# Patient Record
Sex: Female | Born: 1940 | Race: Black or African American | Hispanic: No | Marital: Married | Smoking: Former smoker
Health system: Southern US, Community
[De-identification: ages and names within clinical notes are randomized; demographics above are authoritative.]

## PROBLEM LIST (undated history)

## (undated) DIAGNOSIS — R7301 Impaired fasting glucose: Secondary | ICD-10-CM

## (undated) DIAGNOSIS — K635 Polyp of colon: Secondary | ICD-10-CM

## (undated) DIAGNOSIS — I10 Essential (primary) hypertension: Secondary | ICD-10-CM

## (undated) DIAGNOSIS — E785 Hyperlipidemia, unspecified: Secondary | ICD-10-CM

## (undated) DIAGNOSIS — E213 Hyperparathyroidism, unspecified: Secondary | ICD-10-CM

## (undated) DIAGNOSIS — H9319 Tinnitus, unspecified ear: Secondary | ICD-10-CM

## (undated) DIAGNOSIS — I2510 Atherosclerotic heart disease of native coronary artery without angina pectoris: Secondary | ICD-10-CM

## (undated) DIAGNOSIS — C50919 Malignant neoplasm of unspecified site of unspecified female breast: Secondary | ICD-10-CM

## (undated) DIAGNOSIS — M8580 Other specified disorders of bone density and structure, unspecified site: Secondary | ICD-10-CM

## (undated) HISTORY — DX: Atherosclerotic heart disease of native coronary artery without angina pectoris: I25.10

## (undated) HISTORY — DX: Malignant neoplasm of unspecified site of unspecified female breast: C50.919

## (undated) HISTORY — DX: Other specified disorders of bone density and structure, unspecified site: M85.80

## (undated) HISTORY — DX: Essential (primary) hypertension: I10

## (undated) HISTORY — DX: Impaired fasting glucose: R73.01

## (undated) HISTORY — DX: Hyperparathyroidism, unspecified: E21.3

## (undated) HISTORY — DX: Hyperlipidemia, unspecified: E78.5

## (undated) HISTORY — DX: Tinnitus, unspecified ear: H93.19

## (undated) HISTORY — DX: Polyp of colon: K63.5

---

## 1988-07-30 HISTORY — PX: CHOLECYSTECTOMY: SHX55

## 1995-07-31 DIAGNOSIS — I2510 Atherosclerotic heart disease of native coronary artery without angina pectoris: Secondary | ICD-10-CM

## 1995-07-31 HISTORY — DX: Atherosclerotic heart disease of native coronary artery without angina pectoris: I25.10

## 1995-07-31 HISTORY — PX: CORONARY ARTERY BYPASS GRAFT: SHX141

## 2011-07-31 HISTORY — PX: BREAST SURGERY: SHX581

## 2011-11-21 ENCOUNTER — Ambulatory Visit (HOSPITAL_BASED_OUTPATIENT_CLINIC_OR_DEPARTMENT_OTHER)
Admission: RE | Admit: 2011-11-21 | Discharge: 2011-11-21 | Disposition: A | Discharge status: Home / Self Care | Payer: Medicare Other | Source: Ambulatory Visit | Attending: Family Medicine | Admitting: Family Medicine

## 2011-11-21 ENCOUNTER — Other Ambulatory Visit (HOSPITAL_BASED_OUTPATIENT_CLINIC_OR_DEPARTMENT_OTHER): Payer: Self-pay | Admitting: Family Medicine

## 2011-11-21 DIAGNOSIS — R42 Dizziness and giddiness: Secondary | ICD-10-CM | POA: Insufficient documentation

## 2011-11-21 DIAGNOSIS — W19XXXA Unspecified fall, initial encounter: Secondary | ICD-10-CM | POA: Insufficient documentation

## 2011-11-21 DIAGNOSIS — R51 Headache: Secondary | ICD-10-CM

## 2011-11-21 DIAGNOSIS — S0990XA Unspecified injury of head, initial encounter: Secondary | ICD-10-CM

## 2011-11-21 IMAGING — CT CT HEAD W/O CM
1 series · 16 of 30 positions shown, 20 images · non-contrast
Comparison: None.

CLINICAL DATA: Headache and dizziness.  Fall with head trauma.

CT HEAD WITHOUT CONTRAST
TECHNIQUE: Contiguous axial images were obtained from the base of
the skull through the vertex without contrast.

[Series 2: head 4.8 h37s · axial · 0.45mm/px · z∈[+1127,+1263]mm · 16 of 32 slices shown, 20 images]
[im 2/32  brain]
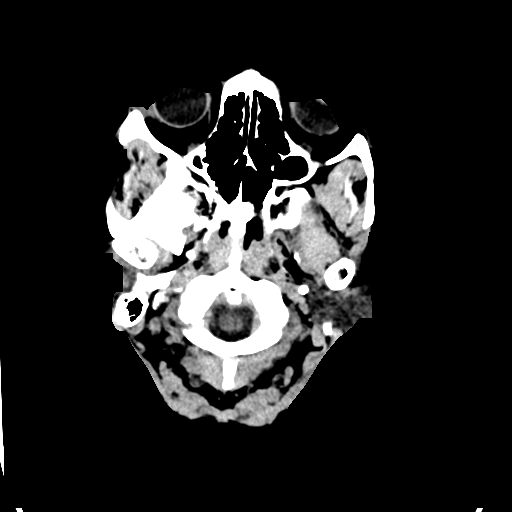
[im 2/32  bone]
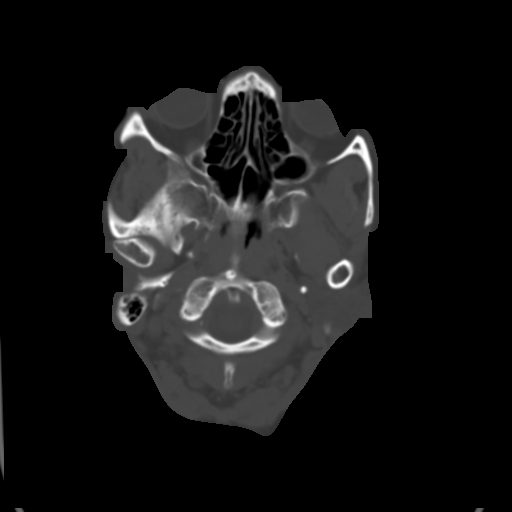
[im 4/32  brain]
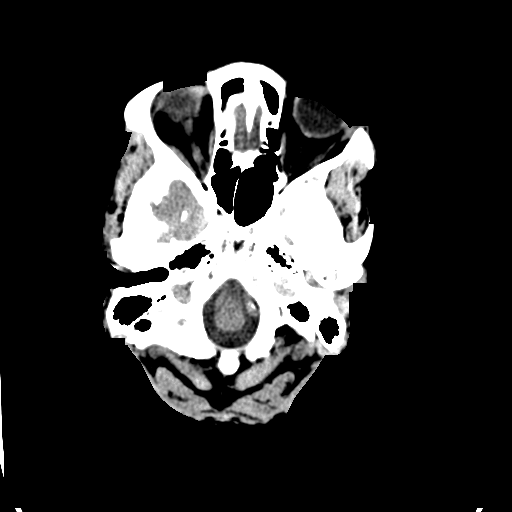
[im 6/32  brain]
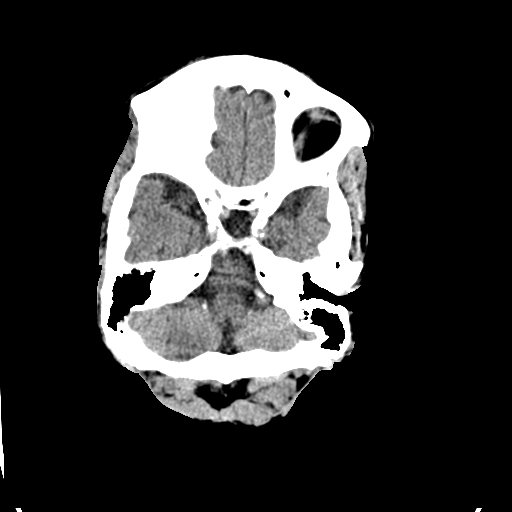
[im 8/32  brain]
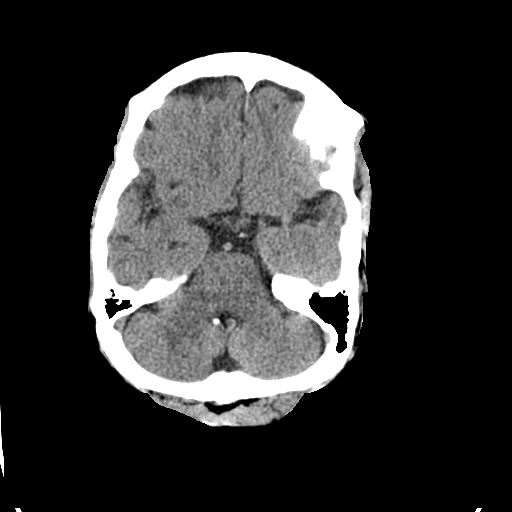
[im 9/32  brain]
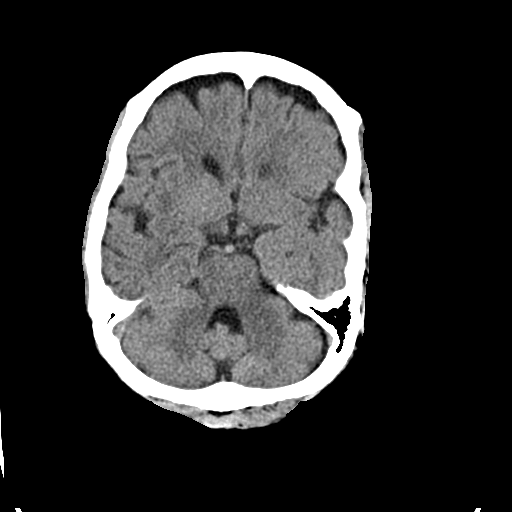
[im 9/32  bone]
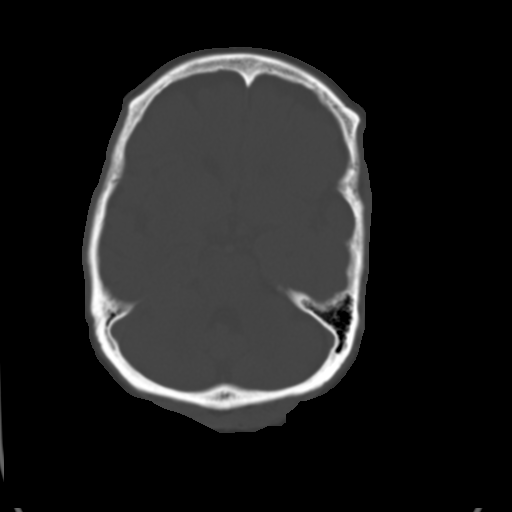
[im 11/32  brain]
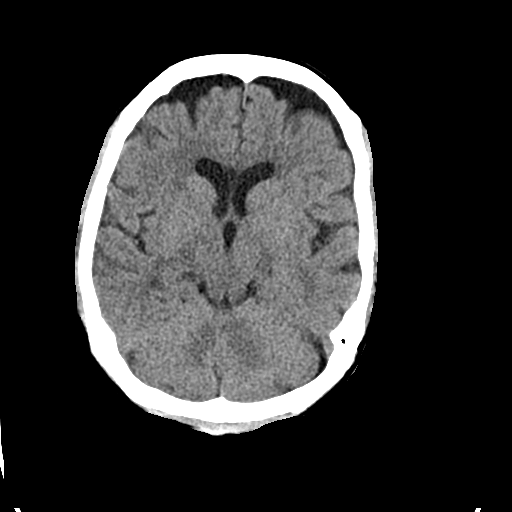
[im 13/32  brain]
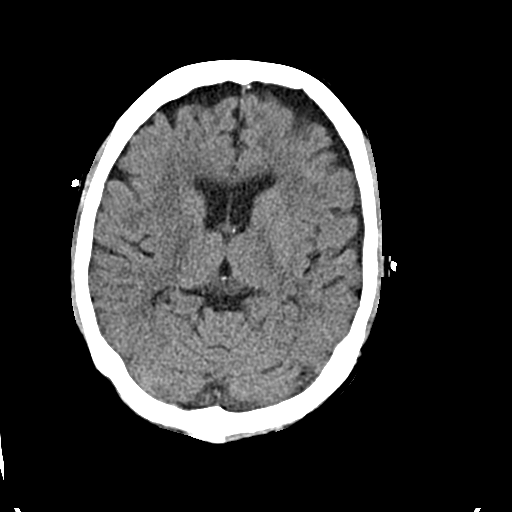
[im 15/32  brain]
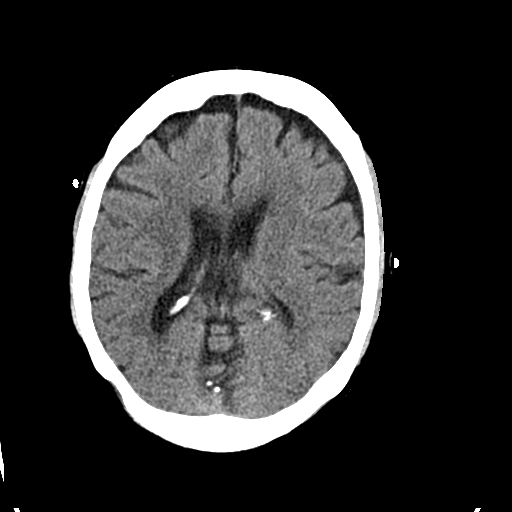
[im 17/32  brain]
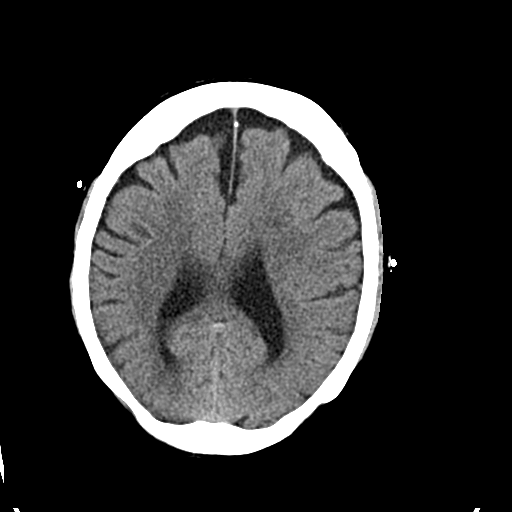
[im 17/32  bone]
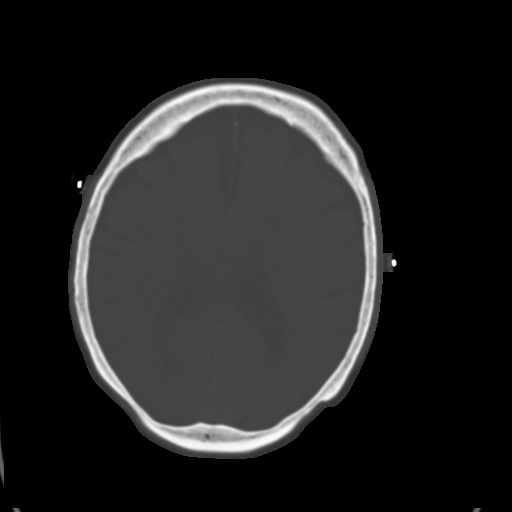
[im 19/32  brain]
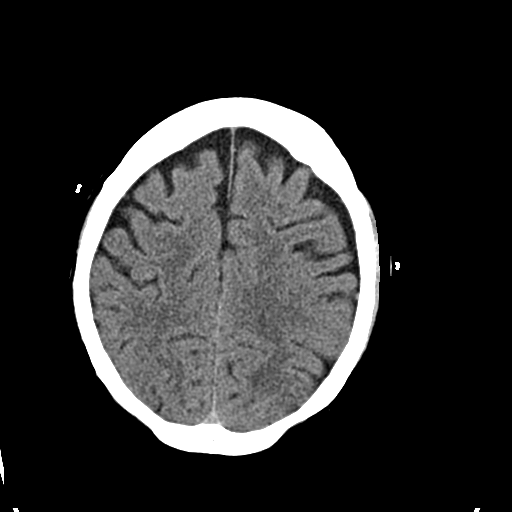
[im 21/32  brain]
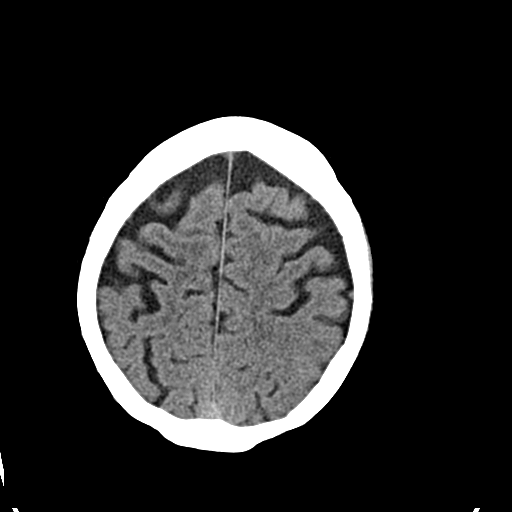
[im 23/32  brain]
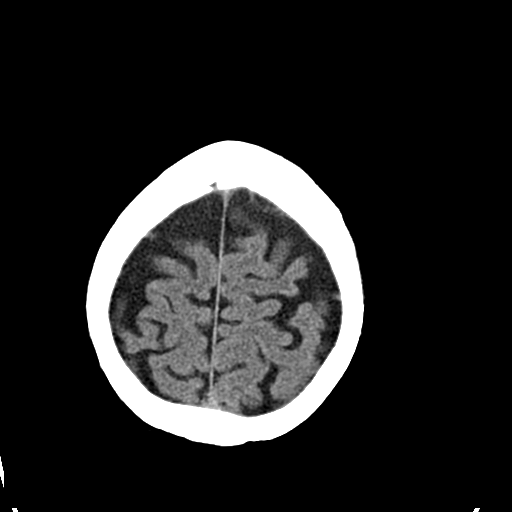
[im 24/32  brain]
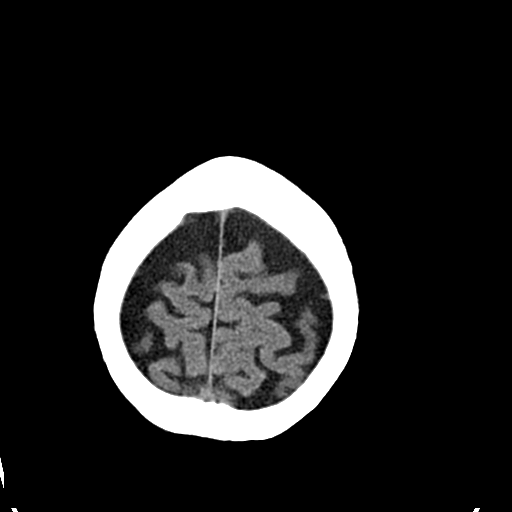
[im 24/32  bone]
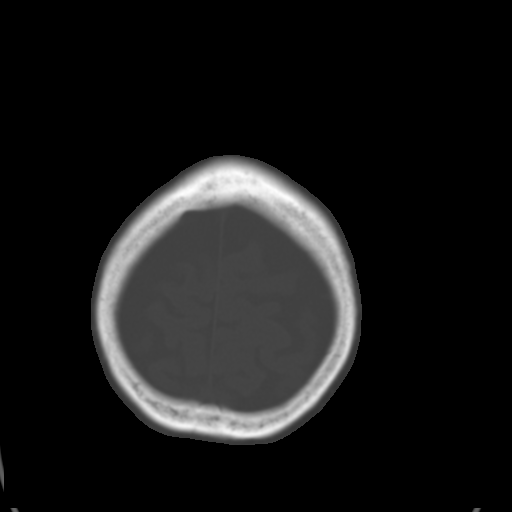
[im 26/32  brain]
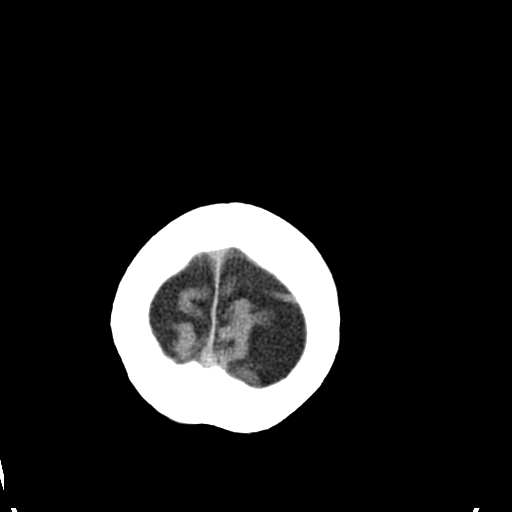
[im 28/32  brain]
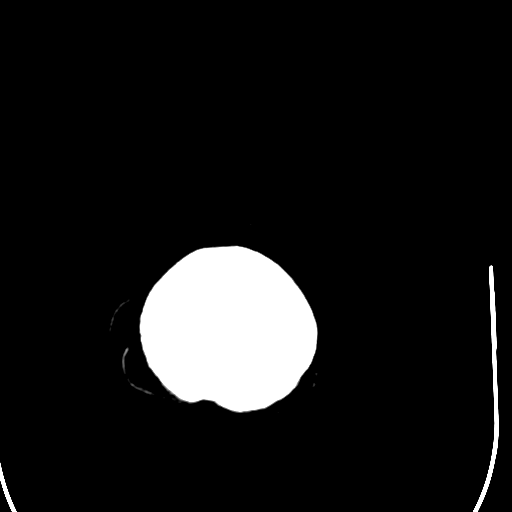
[im 30/32  brain]
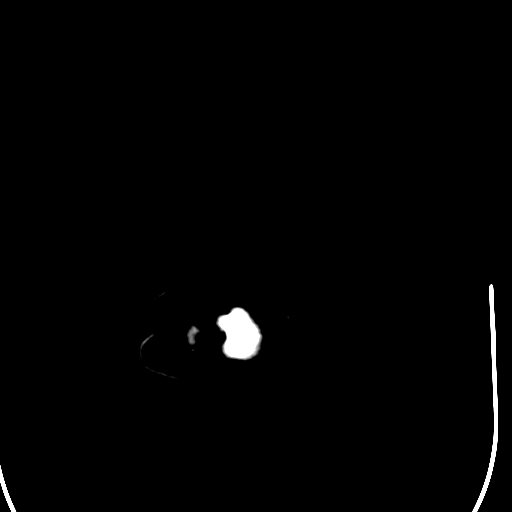

[16 of 30 positions shown; findings below may reference images not displayed]

FINDINGS: The brain shows generalized atrophy.  There is low
density in the subcortical white matter of the right frontal region
consistent with an old infarction.  No sign of acute infarction,
mass lesion, hemorrhage, hydrocephalus or extra-axial collection.
There is atherosclerotic calcification of the major vessels at the
base of the brain.  No skull fracture.  No fluid in the sinuses,
middle ears or mastoids.
IMPRESSION: Atrophy.  No acute or traumatic finding.

## 2012-02-04 LAB — LIPID PANEL
Cholesterol: 158 mg/dL (ref 0–200)
HDL: 48 mg/dL (ref 35–70)

## 2012-03-30 DIAGNOSIS — E213 Hyperparathyroidism, unspecified: Secondary | ICD-10-CM

## 2012-03-30 HISTORY — DX: Hyperparathyroidism, unspecified: E21.3

## 2012-12-02 ENCOUNTER — Telehealth: Payer: Self-pay | Admitting: Family Medicine

## 2012-12-02 NOTE — Telephone Encounter (Signed)
Yes. Bring her at 2 pm. Thanks PG

## 2012-12-02 NOTE — Telephone Encounter (Signed)
PATIENT SAYS THAT SHE IS NOT FEELING WELL AND IS TAKING MEDICATION FOR MEMORY. SHE WANTS TO BE SEEN TODAY.

## 2012-12-04 ENCOUNTER — Ambulatory Visit (INDEPENDENT_AMBULATORY_CARE_PROVIDER_SITE_OTHER): Payer: Medicare Other | Admitting: Family Medicine

## 2012-12-04 ENCOUNTER — Encounter: Payer: Self-pay | Admitting: Family Medicine

## 2012-12-04 VITALS — BP 143/78 | HR 49 | Ht 62.5 in | Wt 132.0 lb

## 2012-12-04 DIAGNOSIS — R413 Other amnesia: Secondary | ICD-10-CM

## 2012-12-04 DIAGNOSIS — F4329 Adjustment disorder with other symptoms: Secondary | ICD-10-CM

## 2012-12-04 DIAGNOSIS — E213 Hyperparathyroidism, unspecified: Secondary | ICD-10-CM

## 2012-12-04 DIAGNOSIS — F4389 Other reactions to severe stress: Secondary | ICD-10-CM

## 2012-12-04 DIAGNOSIS — R5381 Other malaise: Secondary | ICD-10-CM

## 2012-12-04 DIAGNOSIS — F438 Other reactions to severe stress: Secondary | ICD-10-CM

## 2012-12-04 MED ORDER — FLUOXETINE HCL 20 MG PO CAPS: 20.0000 mg | ORAL_CAPSULE | Freq: Every day | ORAL | Status: DC

## 2012-12-04 MED ORDER — MEMANTINE HCL ER 28 MG PO CP24: 1.0000 | ORAL_CAPSULE | Freq: Every day | ORAL | Status: DC

## 2012-12-04 NOTE — Progress Notes (Signed)
  Subjective:    Patient ID: Margaret Moore, female    DOB: 1940-09-11, 72 y.o.   MRN: [id number]  HPI Margaret Moore is here today with her husband Margaret Moore to discuss a few issues.  1)  Fatigue:  She has started to feel very tired over the past several months.    2)   Memory Loss:  She is concerned with her memory.  She feels that her short term memory has declined.  3)  Stress:  This has been a very stressful year for both Margaret Moore and her husband.  Both of them have had increased medical issues and she feels that she is having a harder time taking care of everything that she needs to take care of.       Review of Systems  Constitutional: Positive for activity change and fatigue. Negative for appetite change and unexpected weight change.  HENT: Negative.   Respiratory: Negative for chest tightness and shortness of breath.   Cardiovascular: Negative for chest pain, palpitations and leg swelling.  Gastrointestinal: Negative.   Genitourinary: Negative.   Neurological:       Memory Loss   Psychiatric/Behavioral: Positive for dysphoric mood. Negative for sleep disturbance. The patient is nervous/anxious.        Stressed    Past Medical History  Diagnosis Date  . Cardiovascular disease 1997    Quadruple Bypass Surgery 1997  . Hyperlipidemia   . Hypertension   . 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    . IFG (impaired fasting glucose)   . Tinnitus   . Colon polyps   . Osteopenia    Family History  Problem Relation Age of Onset  . Stroke Mother   . Diabetes Mother   . Heart disease Sister   . Diabetes Sister   . Cancer Sister   . Heart disease Brother    History   Social History Narrative   arital Status: Married Advertising account planner)   Children:  Daughter (Deceased) Step-Children (Sons - 3; Daughter -1) [name] (She lives in [place])   Pets: Cats   Living Situation: Lives with  spouse   Occupation: Retired  Facilities manager (Disabled)   Education: 9 th Grade    Tobacco Use/Exposure:  She smoked 1/2 ppd for > 20 years.  She quit smoking 20 years ago.     Alcohol Use:  None   Drug Use:  None   Diet:  Regular Low Sodium   Exercise:  Walking   Hobbies: Gardening, Flowers                     Objective:   Physical Exam  Constitutional: She appears well-nourished. No distress.  HENT:  Head: Normocephalic.  Eyes: No scleral icterus.  Neck: No thyromegaly present.  Cardiovascular: Normal rate, regular rhythm and normal heart sounds.   Pulmonary/Chest: Effort normal and breath sounds normal.  Abdominal: There is no tenderness.  Musculoskeletal: She exhibits no edema and no tenderness.  Neurological: She is alert.  Skin: Skin is warm and dry.  Psychiatric: She has a normal mood and affect. Her behavior is normal. Judgment and thought content normal.      Assessment & Plan:   TIME 30 MINUTES:  MORE THAN 50 % OF TIME WAS INVOLVED IN COUNSELING.

## 2012-12-04 NOTE — Patient Instructions (Addendum)
1)  Memory - Start on the Namenda sample as directed.  Your prescription is for the 28 mg (green pill). You also may want to take a fish oil called DHA 1000 mg per day to help with memory.    2)  Stress/Mood - Start on the fluoxetine daily.  Be sure and take it every day.     Stress Stress-related medical problems are becoming increasingly common. The body has a built-in physical response to stressful situations. Faced with pressure, challenge or danger, we need to react quickly. Our bodies release hormones such as cortisol and adrenaline to help do this. These hormones are part of the "fight or flight" response and affect the metabolic rate, heart rate and blood pressure, resulting in a heightened, stressed state that prepares the body for optimum performance in dealing with a stressful situation. It is likely that early man required these mechanisms to stay alive, but usually modern stresses do not call for this, and the same hormones released in today's world can damage health and reduce coping ability. CAUSES  Pressure to perform at work, at school or in sports.  Threats of physical violence.  Money worries.  Arguments.  Family conflicts.  Divorce or separation from significant other.  Bereavement.  New job or unemployment.  Changes in location.  Alcohol or drug abuse. SOMETIMES, THERE IS NO PARTICULAR REASON FOR DEVELOPING STRESS. Almost all people are at risk of being stressed at some time in their lives. It is important to know that some stress is temporary and some is long term.  Temporary stress will go away when a situation is resolved. Most people can cope with short periods of stress, and it can often be relieved by relaxing, taking a walk, chatting through issues with friends, or having a good night's sleep.  Chronic (long-term, continuous) stress is much harder to deal with. It can be psychologically and emotionally damaging. It can be harmful both for an individual  and for friends and family. SYMPTOMS Everyone reacts to stress differently. There are some common effects that help [place] recognize it. In times of extreme stress, people may:  Shake uncontrollably.  Breathe faster and deeper than normal (hyperventilate).  Vomit.  For people with asthma, stress can trigger an attack.  For some people, stress may trigger migraine headaches, ulcers, and body pain. PHYSICAL EFFECTS OF STRESS MAY INCLUDE:  Loss of energy.  Skin problems.  Aches and pains resulting from tense muscles, including neck ache, backache and tension headaches.  Increased pain from arthritis and other conditions.  Irregular heart beat (palpitations).  Periods of irritability or anger.  Apathy or depression.  Anxiety (feeling uptight or worrying).  Unusual behavior.  Loss of appetite.  Comfort eating.  Lack of concentration.  Loss of, or decreased, sex-drive.  Increased smoking, drinking, or recreational drug use.  For women, missed periods.  Ulcers, joint pain, and muscle pain. Post-traumatic stress is the stress caused by any serious accident, strong emotional damage, or extremely difficult or violent experience such as rape or war. Post-traumatic stress victims can experience mixtures of emotions such as fear, shame, depression, guilt or anger. It may include recurrent memories or images that may be haunting. These feelings can last for weeks, months or even years after the traumatic event that triggered them. Specialized treatment, possibly with medicines and psychological therapies, is available. If stress is causing physical symptoms, severe distress or making it difficult for you to function as normal, it is worth seeing your caregiver.  It is important to remember that although stress is a usual part of life, extreme or prolonged stress can lead to other illnesses that will need treatment. It is better to visit a doctor sooner rather than later. Stress has been  linked to the development of high blood pressure and heart disease, as well as insomnia and depression. There is no diagnostic test for stress since everyone reacts to it differently. But a caregiver will be able to spot the physical symptoms, such as:  Headaches.  Shingles.  Ulcers. Emotional distress such as intense worry, low mood or irritability should be detected when the doctor asks pertinent questions to identify any underlying problems that might be the cause. In case there are physical reasons for the symptoms, the doctor may also want to do some tests to exclude certain conditions. If you feel that you are suffering from stress, try to identify the aspects of your life that are causing it. Sometimes you may not be able to change or avoid them, but even a small change can have a positive ripple effect. A simple lifestyle change can make all the difference. STRATEGIES THAT CAN HELP DEAL WITH STRESS:  Delegating or sharing responsibilities.  Avoiding confrontations.  Learning to be more assertive.  Regular exercise.  Avoid using alcohol or street drugs to cope.  Eating a healthy, balanced diet, rich in fruit and vegetables and proteins.  Finding humor or absurdity in stressful situations.  Never taking on more than you know you can handle comfortably.  Organizing your time better to get as much done as possible.  Talking to friends or family and sharing your thoughts and fears.  Listening to music or relaxation tapes.  Tensing and then relaxing your muscles, starting at the toes and working up to the head and neck. If you think that you would benefit from help, either in identifying the things that are causing your stress or in learning techniques to help you relax, see a caregiver who is capable of helping you with this. Rather than relying on medications, it is usually better to try and identify the things in your life that are causing stress and try to deal with  them. There are many techniques of managing stress including counseling, psychotherapy, aromatherapy, yoga, and exercise. Your caregiver can help you determine what is best for you. Document Released: 10/06/2002 Document Revised: 10/08/2011 Document Reviewed: 09/02/2007 [facility] Patient Information 2013 [place], [place]. Dementia Dementia is a general term for problems with brain function. A person with dementia has memory loss and a hard time with at least one other brain function such as thinking, speaking, or problem solving. Dementia can affect social functioning, how you do your job, your mood, or your personality. The changes may be hidden for a long time. The earliest forms of this disease are usually not detected by family or friends. Dementia can be:  Irreversible.  Potentially reversible.  Partially reversible.  Progressive. This means it can get worse over time. CAUSES  Irreversible dementia causes may include:  Degeneration of brain cells (Alzheimer's disease or lewy body dementia).  Multiple small strokes (vascular dementia).  Infection (chronic meningitis or Creutzfelt-Jakob disease).  Frontotemporal dementia. This affects younger people, age 16 to 24, compared to those who have Alzheimer's disease.  Dementia associated with other disorders like Parkinson's disease, Huntington's disease, or HIV-associated dementia. Potentially or partially reversible dementia causes may include:  Medicines.  Metabolic causes such as excessive alcohol intake, vitamin B12 deficiency, or thyroid disease.  Masses or pressure in the brain such as a tumor, blood clot, or hydrocephalus. SYMPTOMS  Symptoms are often hard to detect. Family members or coworkers may not notice them early in the disease process. Different people with dementia may have different symptoms. Symptoms can include:  A hard time with memory, especially recent memory. Long-term memory may not be impaired.  Asking the  same question multiple times or forgetting something someone just said.  A hard time speaking your thoughts or finding certain words.  A hard time solving problems or performing familiar tasks (such as how to use a telephone).  Sudden changes in mood.  Changes in personality, especially increasing moodiness or mistrust.  Depression.  A hard time understanding complex ideas that were never a problem in the past. DIAGNOSIS  There are no specific tests for dementia.   Your caregiver may recommend a thorough evaluation. This is because some forms of dementia can be reversible. The evaluation will likely include a physical exam and getting a detailed history from you and a family member. The history often gives the best clues and suggestions for a diagnosis.  Memory testing may be done. A detailed brain function evaluation called neuropsychologic testing may be helpful.  Lab tests and brain imaging (such as a CT scan or MRI scan) are sometimes important.  Sometimes observation and re-evaluation over time is very helpful. TREATMENT  Treatment depends on the cause.   If the problem is a vitamin deficiency, it may be helped or cured with supplements.  For dementias such as Alzheimer's disease, medicines are available to stabilize or slow the course of the disease. There are no cures for this type of dementia.  Your caregiver can help direct you to groups, organizations, and other caregivers to help with decisions in the care of you or your loved one. HOME CARE INSTRUCTIONS The care of individuals with dementia is varied and dependent upon the progression of the dementia. The following suggestions are intended for the person living with, or caring for, the person with dementia.  Create a safe environment.  Remove the locks on bathroom doors to prevent the person from accidentally locking himself or herself in.  Use childproof latches on kitchen cabinets and any place where cleaning  supplies, chemicals, or alcohol are kept.  Use childproof covers in unused electrical outlets.  Install childproof devices to keep doors and windows secured.  Remove stove knobs or install safety knobs and an automatic shut-off on the stove.  Lower the temperature on water heaters.  Label medicines and keep them locked up.  Secure knives, lighters, matches, power tools, and guns, and keep these items out of reach.  Keep the house free from clutter. Remove rugs or anything that might contribute to a fall.  Remove objects that might break and hurt the person.  Make sure lighting is good, both inside and outside.  Install grab rails as needed.  Use a monitoring device to alert you to falls or other needs for help.  Reduce confusion.  Keep familiar objects and people around.  Use night lights or dim lights at night.  Label items or areas.  Use reminders, notes, or directions for daily activities or tasks.  Keep a simple, consistent routine for waking, meals, bathing, dressing, and bedtime.  Create a calm, quiet environment.  Place large clocks and calendars prominently.  Display emergency numbers and home address near all telephones.  Use cues to establish different times of the day. An example is to  open curtains to let the natural light in during the day.   Use effective communication.  Choose simple words and short sentences.  Use a gentle, calm tone of voice.  Be careful not to interrupt.  If the person is struggling to find a word or communicate a thought, try to provide the word or thought.  Ask one question at a time. Allow the person ample time to answer questions. Repeat the question again if the person does not respond.  Reduce nighttime restlessness.  Provide a comfortable bed.  Have a consistent nighttime routine.  Ensure a regular walking or physical activity schedule. Involve the person in daily activities as much as possible.  Limit napping  during the day.  Limit caffeine.  Attend social events that stimulate rather than overwhelm the senses.  Encourage good nutrition and hydration.  Reduce distractions during meal times and snacks.  Avoid foods that are too hot or too cold.  Monitor chewing and swallowing ability.  Continue with routine vision, hearing, dental, and medical screenings.  Only give over-the-counter or prescription medicines as directed by the caregiver.  Monitor driving abilities. Do not allow the person to drive when safe driving is no longer possible.  Register with an identification program which could provide location assistance in the event of a missing person situation. SEEK MEDICAL CARE IF:   New behavioral problems start such as moodiness, aggressiveness, or seeing things that are not there (hallucinations).  Any new problem with brain function happens. This includes problems with balance, speech, or falling a lot.  Problems with swallowing develop.  Any symptoms of other illness happen. Small changes or worsening in any aspect of brain function can be a sign that the illness is getting worse. It can also be a sign of another medical illness such as infection. Seeing a caregiver right away is important. SEEK IMMEDIATE MEDICAL CARE IF:   A fever develops.  New or worsened confusion develops.  New or worsened sleepiness develops.  Staying awake becomes hard to do. Document Released: 01/09/2001 Document Revised: 10/08/2011 Document Reviewed: 12/11/2010 [facility] Patient Information 2013 [place], [place].

## 2012-12-14 ENCOUNTER — Encounter: Payer: Self-pay | Admitting: Family Medicine

## 2012-12-14 DIAGNOSIS — R413 Other amnesia: Secondary | ICD-10-CM | POA: Insufficient documentation

## 2012-12-14 DIAGNOSIS — R5383 Other fatigue: Secondary | ICD-10-CM | POA: Insufficient documentation

## 2012-12-14 DIAGNOSIS — E213 Hyperparathyroidism, unspecified: Secondary | ICD-10-CM | POA: Insufficient documentation

## 2012-12-14 DIAGNOSIS — R5381 Other malaise: Secondary | ICD-10-CM | POA: Insufficient documentation

## 2012-12-14 DIAGNOSIS — F4329 Adjustment disorder with other symptoms: Secondary | ICD-10-CM | POA: Insufficient documentation

## 2012-12-14 NOTE — Assessment & Plan Note (Signed)
This has been a very rough year for Margaret Moore.  She did take some Celexa for a short time last fall which seemed to help her. We'll see how she does on the Namenda and may consider adding a SSRI.

## 2012-12-14 NOTE — Assessment & Plan Note (Signed)
She is to return for a check of her TSH and Free T4.

## 2012-12-14 NOTE — Assessment & Plan Note (Signed)
She was noted to have an increased calcium last fall which led to further evaluation of her parathyroid glands. She was sent for a nuclear scan and was noted to have an adenoma. She has seen Dr. Claudine Mouton for this.  We'll check into what ever came of this and what the plan for her is.  We'll recheck her level to see where her level is.

## 2012-12-14 NOTE — Assessment & Plan Note (Signed)
She was given another sample pack and a prescription for Namenda.

## 2012-12-26 ENCOUNTER — Encounter: Payer: Self-pay | Admitting: Family Medicine

## 2012-12-26 ENCOUNTER — Ambulatory Visit (INDEPENDENT_AMBULATORY_CARE_PROVIDER_SITE_OTHER): Payer: Medicare Other | Admitting: Family Medicine

## 2012-12-26 VITALS — BP 147/76 | HR 50 | Wt 134.0 lb

## 2012-12-26 DIAGNOSIS — N949 Unspecified condition associated with female genital organs and menstrual cycle: Secondary | ICD-10-CM

## 2012-12-26 DIAGNOSIS — R102 Pelvic and perineal pain: Secondary | ICD-10-CM

## 2012-12-26 MED ORDER — PROGESTERONE MICRONIZED 100 MG PO CAPS: 100.0000 mg | ORAL_CAPSULE | Freq: Every day | ORAL | Status: DC

## 2012-12-26 MED ORDER — ESTRADIOL 25 MCG VA TABS: 25.0000 ug | ORAL_TABLET | VAGINAL | Status: AC

## 2012-12-26 NOTE — Progress Notes (Signed)
  Subjective:    Patient ID: Margaret Moore, female    DOB: 10-08-1940, 72 y.o.   MRN: [id number]  Margaret Moore is in today complaining of vaginal pain.  Vaginal Pain The patient's primary symptoms include genital itching and a vaginal discharge. This is a recurrent problem. The current episode started 1 to 4 weeks ago. The problem occurs intermittently. The problem has been unchanged. The pain is mild. The problem affects both sides. She is not pregnant. The vaginal discharge was milky and white. The treatment provided no relief (Terconazole given by Dr Alyce Pagan). She is sexually active. No, her partner does not have an STD. She uses nothing for contraception.   Review of Systems  Genitourinary: Positive for vaginal discharge and vaginal pain.   Past Medical History  Diagnosis Date  . Cardiovascular disease 1997    Quadruple Bypass Surgery 1997  . Hyperlipidemia   . Hypertension   . 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    . IFG (impaired fasting glucose)   . Tinnitus   . Colon polyps   . Osteopenia   . Hyperparathyroidism 03/2012    Left Superior Adenoma    Family History  Problem Relation Age of Onset  . Stroke Mother   . Diabetes Mother   . Heart disease Sister   . Diabetes Sister   . Cancer Sister   . Heart disease Brother    History   Social History Narrative   Marital Status: Married Advertising account planner)   Children:  Daughter (Deceased) Step-Children (Sons - 3; Daughter -1) [name] (She lives in [place])   Pets: Cats   Living Situation: Lives with  spouse   Occupation: Retired Facilities manager (Disabled)   Education: 9 th Grade    Tobacco Use/Exposure:  She smoked 1/2 ppd for > 20 years.  She quit smoking 20 years ago.     Alcohol Use:  None   Drug Use:  None   Diet:  Regular Low Sodium   Exercise:  Walking   Hobbies: Gardening, Flowers                        Objective:   Physical  Exam        Assessment & Plan:

## 2012-12-26 NOTE — Patient Instructions (Addendum)
1)  Vaginal Irritation/Pain -  Insert 1 Vagifem 2 times per week (Sun/Wed) for one month then go to 1-2 per week.  Take 1 Prometrium 100 mg at night to protect your uterus.      Atrophic Vaginitis Atrophic vaginitis is a problem of low levels of estrogen in women. This problem can happen at any age. It is most common in women who have gone through menopause ("the change").  HOW WILL I KNOW IF I HAVE THIS PROBLEM? You may have:  Trouble with peeing (urinating), such as:  Going to the bathroom often.  A hard time holding your pee until you reach a bathroom.  Leaking pee.  Having pain when you pee.  Itching or a burning feeling.  Vaginal bleeding and spotting.  Pain during sex.  Dryness of the vagina.  A yellow, bad-smelling fluid (discharge) coming from the vagina. HOW WILL MY DOCTOR CHECK FOR THIS PROBLEM?  During your exam, your doctor will likely find the problem.  If there is a vaginal fluid, it may be checked for infection. HOW WILL THIS PROBLEM BE TREATED? Keep the vulvar skin as clean as possible. Moisturizers and lubricants can help with some of the symptoms. Estrogen replacement can help. There are 2 ways to take estrogen:  Systemic estrogen gets estrogen to your whole body. It takes many weeks or months before the symptoms get better.  You take an estrogen pill.  You use a skin patch. This is a patch that you put on your skin.  If you still have your uterus, your doctor may ask you to take a hormone. Talk to your doctor about the right medicine for you.  Estrogen cream.  This puts estrogen only at the part of your body where you apply it. The cream is put into the vagina or put on the vulvar skin. For some women, estrogen cream works faster than pills or the patch. CAN ALL WOMEN WITH THIS PROBLEM USE ESTROGEN? No. Women with certain types of cancer, liver problems, or problems with blood clots should not take estrogen. Your doctor can help you decide the best  treatment for your symptoms. Document Released: 01/02/2008 Document Revised: 10/08/2011 Document Reviewed: 01/02/2008 [facility] Patient Information 2014 [place], [place].

## 2013-01-02 ENCOUNTER — Ambulatory Visit (INDEPENDENT_AMBULATORY_CARE_PROVIDER_SITE_OTHER): Payer: Medicare Other | Admitting: Family Medicine

## 2013-01-02 ENCOUNTER — Encounter: Payer: Self-pay | Admitting: Family Medicine

## 2013-01-02 VITALS — BP 115/72 | HR 54 | Wt 136.0 lb

## 2013-01-02 DIAGNOSIS — R102 Pelvic and perineal pain: Secondary | ICD-10-CM

## 2013-01-02 DIAGNOSIS — R42 Dizziness and giddiness: Secondary | ICD-10-CM

## 2013-01-02 DIAGNOSIS — N949 Unspecified condition associated with female genital organs and menstrual cycle: Secondary | ICD-10-CM

## 2013-01-02 DIAGNOSIS — R413 Other amnesia: Secondary | ICD-10-CM

## 2013-01-02 MED ORDER — MEMANTINE HCL ER 21 MG PO CP24: 1.0000 | ORAL_CAPSULE | Freq: Every day | ORAL | Status: DC

## 2013-01-02 NOTE — Patient Instructions (Addendum)
1)  Pain in vagina - Do the estrogen pill 1 x per week and continue on the progesterone orally at night. We are setting you up to see Dr. Arnette Schaumann with [facility] at the hospital so see what she thinks about your pain.

## 2013-01-02 NOTE — Progress Notes (Signed)
  Subjective:    Patient ID: Margaret Moore, female    DOB: [birth year], 72 y.o.   MRN: [id number]  HPI:    [name] is here today to discuss a few issues:    1)  Vaginal Pain:  She continues to have vaginal pain. She has had this off and on for several years.   She was seen by Dr. Alyce Pagan several weeks ago for the same complaint.  She says that when she saw him he told her that she had yeast infection.  He treated her with a vaginal cream which really did not help her pain.  When I saw her last week, I gave her some Vagifem and Prometrium thinking that her discomfort may be due to atrophic vaginitis.  She has been using it since last week and says that it has not helped her vaginal pain.      2)  Dizziness:  She has been feeling dizzy since she started on the Namenda XR 28 mg.   Review of Systems  Genitourinary: Positive for vaginal pain. Negative for vaginal bleeding, vaginal discharge and difficulty urinating.  Neurological: Positive for dizziness.     Past Medical History  Diagnosis Date  . Cardiovascular disease 1997    Quadruple Bypass Surgery 1997  . Hyperlipidemia   . Hypertension   . 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    . IFG (impaired fasting glucose)   . Tinnitus   . Colon polyps   . Osteopenia   . Hyperparathyroidism 03/2012    Left Superior Adenoma    Family History  Problem Relation Age of Onset  . Stroke Mother   . Diabetes Mother   . Heart disease Sister   . Diabetes Sister   . Cancer Sister   . Heart disease Brother    History   Social History Narrative   Marital Status: Married Advertising account planner)   Children:  Daughter (Deceased) Step-Children (Sons - 3; Daughter -1) [name] (She lives in [place])   Pets: Cats   Living Situation: Lives with  spouse   Occupation: Retired Facilities manager (Disabled)   Education: 9 th Grade    Tobacco Use/Exposure:  She smoked 1/2 ppd for > 20  years.  She quit smoking 20 years ago.     Alcohol Use:  None   Drug Use:  None   Diet:  Regular Low Sodium   Exercise:  Walking   Hobbies: Gardening, Flowers                        Objective:   Physical Exam  Cardiovascular: Normal rate, regular rhythm and normal heart sounds.   Pulmonary/Chest: Effort normal and breath sounds normal.  Genitourinary: Vagina normal. No vaginal discharge found.       Assessment & Plan:

## 2013-01-02 NOTE — Assessment & Plan Note (Signed)
Margaret Moore's discomfort is probably due to her atrophic vaginitis.  She is going to try a combination of Vagifem and Prometrium.

## 2013-01-04 DIAGNOSIS — R42 Dizziness and giddiness: Secondary | ICD-10-CM | POA: Insufficient documentation

## 2013-01-04 NOTE — Assessment & Plan Note (Addendum)
Margaret Moore says that her dizziness did not start until she increased the dosage of Namenda to 28 mg.  Hopefully, this dizziness will resolve when we lower her dosage.

## 2013-01-04 NOTE — Assessment & Plan Note (Signed)
I counted the number of times that I have seen Margaret Moore since 2006 for vaginal complaints (itching, discharge,pain) and her visits number 25 for these problems.  I also know that she has seen Dr. Alyce Pagan as well.  I absolutely have no idea of what to do about her complaints.  I am going to refer her to Dr. Arnette Schaumann to see if she can offer a suggestion for these vaginal complaints.  My hope was that the vaginal estrogen would help with her complaints but it does not seem to have helped.  We'll see what Dr. Cliffton Asters recommends.

## 2013-01-04 NOTE — Assessment & Plan Note (Signed)
She was given a prescription for Namenda XR 21 mg.

## 2013-01-05 ENCOUNTER — Encounter: Payer: Self-pay | Admitting: Family Medicine

## 2013-01-06 ENCOUNTER — Telehealth: Payer: Self-pay | Admitting: Family Medicine

## 2013-01-06 ENCOUNTER — Encounter (HOSPITAL_BASED_OUTPATIENT_CLINIC_OR_DEPARTMENT_OTHER): Payer: Self-pay | Admitting: *Deleted

## 2013-01-06 ENCOUNTER — Emergency Department (HOSPITAL_BASED_OUTPATIENT_CLINIC_OR_DEPARTMENT_OTHER)
Admission: EM | Admit: 2013-01-06 | Discharge: 2013-01-06 | Disposition: A | Discharge status: Home / Self Care | Payer: Medicare Other | Attending: Emergency Medicine | Admitting: Emergency Medicine

## 2013-01-06 DIAGNOSIS — I10 Essential (primary) hypertension: Secondary | ICD-10-CM | POA: Insufficient documentation

## 2013-01-06 DIAGNOSIS — Z951 Presence of aortocoronary bypass graft: Secondary | ICD-10-CM | POA: Insufficient documentation

## 2013-01-06 DIAGNOSIS — Z860100 Personal history of colon polyps, unspecified: Secondary | ICD-10-CM | POA: Insufficient documentation

## 2013-01-06 DIAGNOSIS — Z8639 Personal history of other endocrine, nutritional and metabolic disease: Secondary | ICD-10-CM | POA: Insufficient documentation

## 2013-01-06 DIAGNOSIS — R531 Weakness: Secondary | ICD-10-CM

## 2013-01-06 DIAGNOSIS — Z8739 Personal history of other diseases of the musculoskeletal system and connective tissue: Secondary | ICD-10-CM | POA: Insufficient documentation

## 2013-01-06 DIAGNOSIS — I2510 Atherosclerotic heart disease of native coronary artery without angina pectoris: Secondary | ICD-10-CM | POA: Insufficient documentation

## 2013-01-06 DIAGNOSIS — R5381 Other malaise: Secondary | ICD-10-CM | POA: Insufficient documentation

## 2013-01-06 DIAGNOSIS — Z79899 Other long term (current) drug therapy: Secondary | ICD-10-CM | POA: Insufficient documentation

## 2013-01-06 DIAGNOSIS — Z862 Personal history of diseases of the blood and blood-forming organs and certain disorders involving the immune mechanism: Secondary | ICD-10-CM | POA: Insufficient documentation

## 2013-01-06 DIAGNOSIS — Z87891 Personal history of nicotine dependence: Secondary | ICD-10-CM | POA: Insufficient documentation

## 2013-01-06 DIAGNOSIS — Z8669 Personal history of other diseases of the nervous system and sense organs: Secondary | ICD-10-CM | POA: Insufficient documentation

## 2013-01-06 DIAGNOSIS — Z853 Personal history of malignant neoplasm of breast: Secondary | ICD-10-CM | POA: Insufficient documentation

## 2013-01-06 DIAGNOSIS — F0390 Unspecified dementia without behavioral disturbance: Secondary | ICD-10-CM | POA: Insufficient documentation

## 2013-01-06 LAB — BASIC METABOLIC PANEL
BUN: 23 mg/dL (ref 6–23)
Calcium: 10.8 mg/dL — ABNORMAL HIGH (ref 8.4–10.5)
Creatinine, Ser: 1 mg/dL (ref 0.50–1.10)
GFR calc Af Amer: 65 mL/min — ABNORMAL LOW (ref >90)
GFR calc non Af Amer: 56 mL/min — ABNORMAL LOW (ref >90)

## 2013-01-06 LAB — URINALYSIS, ROUTINE W REFLEX MICROSCOPIC
Bilirubin Urine: NEGATIVE
Hgb urine dipstick: NEGATIVE
Specific Gravity, Urine: 1.005 (ref 1.005–1.030)
Urobilinogen, UA: 1 mg/dL (ref 0.0–1.0)

## 2013-01-06 LAB — CBC WITH DIFFERENTIAL/PLATELET
Basophils Relative: 1 % (ref 0–1)
Eosinophils Absolute: 0.1 10*3/uL (ref 0.0–0.7)
Eosinophils Relative: 2 % (ref 0–5)
HCT: 35.7 % — ABNORMAL LOW (ref 36.0–46.0)
Hemoglobin: 12.1 g/dL (ref 12.0–15.0)
MCH: 29.7 pg (ref 26.0–34.0)
MCHC: 33.9 g/dL (ref 30.0–36.0)
Monocytes Absolute: 0.5 10*3/uL (ref 0.1–1.0)
Monocytes Relative: 9 % (ref 3–12)

## 2013-01-06 MED ORDER — SODIUM CHLORIDE 0.9 % IV BOLUS (SEPSIS)
1000.0000 mL | Freq: Once | INTRAVENOUS | Status: AC
  Administered 2013-01-06: 1000 mL via INTRAVENOUS

## 2013-01-06 NOTE — ED Provider Notes (Signed)
History     CSN: [id number]  Arrival date & time 01/06/13  1654   First MD Initiated Contact with Patient 01/06/13 1657      Chief Complaint  Patient presents with  . Weakness    (Consider location/radiation/quality/duration/timing/severity/associated sxs/prior treatment) Patient is a 72 y.o. female presenting with weakness.  Weakness   PT with history of CAD and ?dementia reports several days of general weakness, no other specific complaints except she had a brief episode of fleeting sharp chest pain enroute that resolved after a few seconds. She denies any N/V/D, constipation, fever, SOB, cough or anorexia. She was seen by PCP for same last week and felt to be related to recently increased Namenda dose which was decreased at that visit. Apparently a home health nurse was at her house today and noted her pulse was 'irregular' and recommended she come to the ED for evaluation.   Past Medical History  Diagnosis Date  . Cardiovascular disease 1997    Quadruple Bypass Surgery 1997  . Hyperlipidemia   . Hypertension   . 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    . IFG (impaired fasting glucose)   . Tinnitus   . Colon polyps   . Osteopenia   . Hyperparathyroidism 03/2012    Left Superior Adenoma     Past Surgical History  Procedure Laterality Date  . Coronary artery bypass graft  1997    Quadruple   . Breast surgery  2013    She went in for breast reduction and the pathology report showed breast cancer.    . Cholecystectomy  1990    Family History  Problem Relation Age of Onset  . Stroke Mother   . Diabetes Mother   . Heart disease Sister   . Diabetes Sister   . Cancer Sister   . Heart disease Brother     History  Substance Use Topics  . Smoking status: Former Smoker -- 0.50 packs/day for 20 years    Types: Cigarettes  . Smokeless tobacco: Not on file  . Alcohol Use: No    OB  History   Grav Para Term Preterm Abortions TAB SAB Ect Mult Living                  Review of Systems  Neurological: Positive for weakness.   All other systems reviewed and are negative except as noted in HPI.   Allergies  Ace inhibitors and Codeine  Home Medications   Current Outpatient Rx  Name  Route  Sig  Dispense  Refill  . atenolol-chlorthalidone (TENORETIC) 50-25 MG per tablet               . estradiol (VAGIFEM) 25 MCG vaginal tablet   Vaginal   Place 1 tablet (25 mcg total) vaginally 2 (two) times a week.   8 tablet   11   . FLUoxetine (PROZAC) 20 MG capsule   Oral   Take 1 capsule (20 mg total) by mouth daily.   30 capsule   2   . Memantine HCl ER (NAMENDA XR) 21 MG CP24   Oral   Take 1 capsule by mouth daily.   30 capsule   11   . Memantine HCl ER (NAMENDA XR) 28 MG CP24   Oral   Take 28 mg by mouth daily.   30 capsule   11   . NEXIUM  40 MG capsule               . progesterone (PROMETRIUM) 100 MG capsule   Oral   Take 1 capsule (100 mg total) by mouth at bedtime.   30 capsule   11   . valsartan (DIOVAN) 160 MG tablet   Oral   Take 160 mg by mouth daily. Take 0.5 to 1 tab po daily.           BP 110/56  Pulse 56  Temp(Src) 98.6 F (37 C) (Oral)  Resp 16  Ht 5\' 4"  (1.626 m)  Wt 131 lb (59.421 kg)  BMI 22.47 kg/m2  SpO2 97%  Physical Exam  Nursing note and vitals reviewed. Constitutional: She is oriented to person, place, and time. She appears well-developed and well-nourished.  HENT:  Head: Normocephalic and atraumatic.  Eyes: EOM are normal. Pupils are equal, round, and reactive to light.  Neck: Normal range of motion. Neck supple.  Cardiovascular: Normal rate, normal heart sounds and intact distal pulses.   Pulmonary/Chest: Effort normal and breath sounds normal.  Abdominal: Bowel sounds are normal. She exhibits no distension. There is no tenderness.  Musculoskeletal: Normal range of motion. She exhibits no edema and no  tenderness.  Neurological: She is alert and oriented to person, place, and time. She has normal strength. No cranial nerve deficit or sensory deficit.  Skin: Skin is warm and dry. No rash noted.  Psychiatric: She has a normal mood and affect.    ED Course  Procedures (including critical care time)  Labs Reviewed  CBC WITH DIFFERENTIAL - Abnormal; Notable for the following:    HCT 35.7 (*)    All other components within normal limits  BASIC METABOLIC PANEL - Abnormal; Notable for the following:    Calcium 10.8 (*)    GFR calc non Af Amer 56 (*)    GFR calc Af Amer 65 (*)    All other components within normal limits  TROPONIN I  URINALYSIS, ROUTINE W REFLEX MICROSCOPIC   No results found.   1. General weakness       MDM   Date: 01/06/2013  Rate: 63  Rhythm: normal sinus rhythm  QRS Axis: normal  Intervals: normal  ST/T Wave abnormalities: nonspecific T wave changes  Conduction Disutrbances:none  Narrative Interpretation:   Old EKG Reviewed: none available  Pt with generalized weakness. She has no known history of atrial fibrillation and none seen here. She has a mild sinus arrhythmia and ?PVC on my exam which may have been interpreted by home health nurse as 'irregular'. Her fleeting chest pain does not sound ischemic and doubt that her symptoms are cardiac in etiology. Will check for anemia, elyte disturbances.    7:48 PM Pt feeling better, labs unremarkable. Nonspecific complaints, no evidence of cardiac etiology. No afib. PCP followup.        Charles B. Bernette Mayers, MD 01/06/13 1949

## 2013-01-06 NOTE — ED Notes (Signed)
Weakness x 3 days. Denies other complaints.

## 2013-01-06 NOTE — Telephone Encounter (Signed)
I called patient who was weak and slow to speak after receiving a note from Jasmin W. Stating that NP from [facility] detected irregular heartbeat in patient.  Advised patient to go to ER to be evaluated.  I also spoke with patient's husband who was also concerned.  Will notify Dr. Alberteen Sam.

## 2013-01-16 ENCOUNTER — Ambulatory Visit (INDEPENDENT_AMBULATORY_CARE_PROVIDER_SITE_OTHER): Payer: Medicare Other | Admitting: Family Medicine

## 2013-01-16 ENCOUNTER — Encounter: Payer: Self-pay | Admitting: Family Medicine

## 2013-01-16 ENCOUNTER — Ambulatory Visit: Payer: Medicare Other | Admitting: Family Medicine

## 2013-01-16 VITALS — BP 93/61 | HR 58 | Temp 97.7°F | Resp 22 | Wt 133.0 lb

## 2013-01-16 DIAGNOSIS — R109 Unspecified abdominal pain: Secondary | ICD-10-CM

## 2013-01-16 DIAGNOSIS — L299 Pruritus, unspecified: Secondary | ICD-10-CM

## 2013-01-16 DIAGNOSIS — E213 Hyperparathyroidism, unspecified: Secondary | ICD-10-CM

## 2013-01-16 MED ORDER — PROMETHAZINE-DM 6.25-15 MG/5ML PO SYRP: ORAL_SOLUTION | ORAL | Status: DC

## 2013-01-16 MED ORDER — BENZONATATE 100 MG PO CAPS: ORAL_CAPSULE | ORAL | Status: DC

## 2013-01-16 MED ORDER — DULOXETINE HCL 30 MG PO CPEP: ORAL_CAPSULE | ORAL | Status: DC

## 2013-01-16 NOTE — Patient Instructions (Addendum)
1)  Abdominal Pain - You are scheduled to see Dr. Norma Fredrickson [facility]) on Thursday June 26 th at the [facility] location. They want you to be there at 10:30 am.  The plan as I understand it is to get set up for an ERCP with Dr. Marcelene Butte.   2)  Hyperparathyroidism - You need to get this figured out with Dr. Claudine Mouton.    Endoscopic Retrograde Cholangiopancreatography (ERCP) ERCP stands for endoscopic retrograde cholangiopancreatography. In this procedure, a thin, lighted tube (endoscope) is used. It is passed through the mouth and down the back of the throat into the upper part of the intestine, called the duodenum. A small, plastic tube (cannula) is then passed through the endoscope. It is directed into the bile duct or pancreatic duct. Dye is then injected through the tube. X-rays are taken to study the biliary and pancreatic passageways. This procedure is used to diagnosis many diseases of the pancreas, bile ducts, liver, and gallbladder. LET YOUR CAREGIVER KNOW ABOUT:   Allergies to food or medicine.  Medicines taken, including vitamins, herbs, eyedrops, over-the-counter medicines, and creams.  Use of steroids (by mouth or creams).  Previous problems with anesthetics or numbing medicines.  History of bleeding problems or blood clots.  Previous surgery.  Other health problems, including diabetes and kidney problems.  Possibility of pregnancy, if this applies.  Any barium X-rays during the past week. BEFORE THE PROCEDURE   Do not eat or drink anything, including water, for at least 6 hours before the procedure.  Ask your caregiver whether you should stop taking certain medicines prior to your procedure.  Arrange for someone to drive you home. You will not be allowed to drive for several hours after the procedure.  Arrive at least 60 minutes before the procedure or as directed. This will give you time to check in and fill out any necessary paperwork. PROCEDURE   You will be  given medicine through a vein (intravenously) to make you relaxed and sleepy.  You might have a breathing tube placed to give you medicine that makes you sleep (general anesthetic).  Your throat may be sprayed with medicine that numbs the area (local anesthetic).  You will lie on your left side. The endoscope will be inserted through your mouth and into the duodenum. The tube will not interfere with your breathing. Gagging is prevented by the anesthesia.  While X-rays are being taken, you may be positioned on your stomach. During the ERCP, the person performing the procedure may identify a blockage or narrowed opening to the bile duct. A muscular portion of the main bile duct may be partially cut (sphincterotomy). A thin, plastic tube (stent) will be positioned inside your bile duct. This is to allow fluid secreted by the liver (bile) to flow more easily through the narrowed opening. AFTER THE PROCEDURE   You will rest in bed until you are fully conscious.  When you first wake up, your throat may feel slightly sore.  You will not be allowed to eat or drink until numbness subsides.  Once you are able to drink, urinate, and sit on the edge of the bed without feeling sick to your stomach (nauseous) or dizzy, you may be allowed to go home.  Have a friend or family member with you for the first 24 hours after your procedure. SEEK MEDICAL CARE IF:   You have an oral temperature above 102 F (38.9 C).  You develop other signs of infection, including chills or feeling  unwell.  You have abdominal pain.  You have questions or concerns. MAKE SURE YOU:   Understand these instructions.  Will watch your condition.  Will get help right away if you are not doing well or get worse. Document Released: 04/10/2001 Document Revised: 10/08/2011 Document Reviewed: 11/01/2009 [facility] Patient Information 2014 [place], [place].

## 2013-01-19 LAB — PARATHYROID HORMONE, INTACT (NO CA): PTH: 155 pg/mL — ABNORMAL HIGH (ref 14.0–72.0)

## 2013-01-27 LAB — HM MAMMOGRAPHY: HM Mammogram: NORMAL

## 2013-02-03 MED ORDER — HYDROXYZINE HCL 25 MG PO TABS: ORAL_TABLET | ORAL | Status: DC

## 2013-02-17 ENCOUNTER — Telehealth: Payer: Self-pay

## 2013-02-17 NOTE — Telephone Encounter (Signed)
A fax was received stating that there was already an approved request on file for Hydroxyzine HCL for the patient. LB

## 2013-02-17 NOTE — Telephone Encounter (Addendum)
Ms. Wannamaker called back due to a letter she received from the insurance company in reference to Rx. I explained to her what the letter was saying about the medication being approved.  She was instructed to discontinue prior Rx for her condition. The patient verbalized understanding and agreed to the plan. I instructed her to call if she has any other questions or concerns. LB

## 2013-02-22 DIAGNOSIS — L299 Pruritus, unspecified: Secondary | ICD-10-CM | POA: Insufficient documentation

## 2013-02-22 DIAGNOSIS — R109 Unspecified abdominal pain: Secondary | ICD-10-CM | POA: Insufficient documentation

## 2013-02-22 NOTE — Progress Notes (Signed)
  Subjective:    Patient ID: Margaret Moore, female    DOB: 04-02-1941, 72 y.o.   MRN: [id number]  HPI Margaret Moore presents to office today with abdominal pain.  She was recently seen at [facility] and had an abdominal CT and MRI. She really does not understand what was found and what she has to do.    Review of Systems  Constitutional: Positive for appetite change, fatigue and unexpected weight change. Negative for fever and activity change.  Respiratory: Negative.   Cardiovascular: Negative for chest pain, palpitations and leg swelling.  Gastrointestinal: Positive for abdominal pain. Negative for abdominal distention.   Past Medical History  Diagnosis Date  . Cardiovascular disease 1997    Quadruple Bypass Surgery 1997  . Hyperlipidemia   . Hypertension   . 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    . IFG (impaired fasting glucose)   . Tinnitus   . Colon polyps   . Osteopenia   . Hyperparathyroidism 03/2012    Left Superior Adenoma    Family History  Problem Relation Age of Onset  . Stroke Mother   . Diabetes Mother   . Heart disease Sister   . Diabetes Sister   . Cancer Sister   . Heart disease Brother    History   Social History Narrative   Marital Status: Married Advertising account planner)   Children:  Daughter (Deceased) Step-Children (Sons - 3; Daughter -1) [name] (She lives in [place])   Pets: Cats   Living Situation: Lives with  spouse   Occupation: Retired Facilities manager (Disabled)   Education: 9 th Grade    Tobacco Use/Exposure:  She smoked 1/2 ppd for > 20 years.  She quit smoking 20 years ago.     Alcohol Use:  None   Drug Use:  None   Diet:  Regular Low Sodium   Exercise:  Walking   Hobbies: Gardening, Flowers                          Objective:   Physical Exam  Constitutional: No distress.  HENT:  Nose: Nose normal.  Mouth/Throat: Oropharynx is clear and moist. Mucous  membranes are dry.  Eyes: No scleral icterus.  Neck: Neck supple.  Cardiovascular: Normal rate, regular rhythm and normal heart sounds.   Pulmonary/Chest: Effort normal and breath sounds normal.  Abdominal: Soft. Bowel sounds are normal. She exhibits no distension and no mass. There is no tenderness. There is no rebound and no guarding.  Musculoskeletal: She exhibits no edema.  Lymphadenopathy:    She has no cervical adenopathy.  Neurological: She is alert.  Skin: Skin is warm and dry. She is not diaphoretic.  Psychiatric: She has a normal mood and affect. Her behavior is normal. Judgment and thought content normal.       Assessment & Plan:

## 2013-02-22 NOTE — Assessment & Plan Note (Addendum)
She has a follow up with Dr. Marcelene Butte for an ERCP.

## 2013-02-22 NOTE — Assessment & Plan Note (Signed)
Rechecking a PTH and getting her back with Dr. Claudine Mouton.

## 2013-04-15 ENCOUNTER — Emergency Department (HOSPITAL_BASED_OUTPATIENT_CLINIC_OR_DEPARTMENT_OTHER)
Admission: EM | Admit: 2013-04-15 | Discharge: 2013-04-15 | Disposition: A | Discharge status: Home / Self Care | Payer: Medicare Other | Attending: Emergency Medicine | Admitting: Emergency Medicine

## 2013-04-15 ENCOUNTER — Encounter (HOSPITAL_BASED_OUTPATIENT_CLINIC_OR_DEPARTMENT_OTHER): Payer: Self-pay

## 2013-04-15 DIAGNOSIS — Z860100 Personal history of colon polyps, unspecified: Secondary | ICD-10-CM | POA: Insufficient documentation

## 2013-04-15 DIAGNOSIS — Z862 Personal history of diseases of the blood and blood-forming organs and certain disorders involving the immune mechanism: Secondary | ICD-10-CM | POA: Insufficient documentation

## 2013-04-15 DIAGNOSIS — Z951 Presence of aortocoronary bypass graft: Secondary | ICD-10-CM | POA: Insufficient documentation

## 2013-04-15 DIAGNOSIS — I10 Essential (primary) hypertension: Secondary | ICD-10-CM | POA: Insufficient documentation

## 2013-04-15 DIAGNOSIS — Z8639 Personal history of other endocrine, nutritional and metabolic disease: Secondary | ICD-10-CM | POA: Insufficient documentation

## 2013-04-15 DIAGNOSIS — G500 Trigeminal neuralgia: Secondary | ICD-10-CM | POA: Insufficient documentation

## 2013-04-15 DIAGNOSIS — Z8669 Personal history of other diseases of the nervous system and sense organs: Secondary | ICD-10-CM | POA: Insufficient documentation

## 2013-04-15 DIAGNOSIS — Z853 Personal history of malignant neoplasm of breast: Secondary | ICD-10-CM | POA: Insufficient documentation

## 2013-04-15 DIAGNOSIS — Z79899 Other long term (current) drug therapy: Secondary | ICD-10-CM | POA: Insufficient documentation

## 2013-04-15 DIAGNOSIS — Z87891 Personal history of nicotine dependence: Secondary | ICD-10-CM | POA: Insufficient documentation

## 2013-04-15 DIAGNOSIS — Z8739 Personal history of other diseases of the musculoskeletal system and connective tissue: Secondary | ICD-10-CM | POA: Insufficient documentation

## 2013-04-15 DIAGNOSIS — IMO0002 Reserved for concepts with insufficient information to code with codable children: Secondary | ICD-10-CM | POA: Insufficient documentation

## 2013-04-15 MED ORDER — TRAMADOL HCL 50 MG PO TABS
50.0000 mg | ORAL_TABLET | Freq: Once | ORAL | Status: AC
  Administered 2013-04-15: 50 mg via ORAL
  Filled 2013-04-15: qty 1

## 2013-04-15 MED ORDER — TRAMADOL HCL 50 MG PO TABS: 50.0000 mg | ORAL_TABLET | Freq: Three times a day (TID) | ORAL | Status: DC | PRN

## 2013-04-15 NOTE — ED Provider Notes (Signed)
CSN: [id number]     Arrival date & time 04/15/13  1153 History   First MD Initiated Contact with Patient 04/15/13 1208     Chief Complaint  Patient presents with  . Headache   (Consider location/radiation/quality/duration/timing/severity/associated sxs/prior Treatment) Patient is a 72 y.o. female presenting with headaches. The history is provided by the patient. No language interpreter was used.  Headache Pain location:  R temporal Associated symptoms: no fever, no nausea and no neck pain   Associated symptoms comment:  Sharp, shooting pain in right pre-auricular ear extending into face for the past 2 days. She states that pain last seconds and then resolves and returns about every 45-60 minutes. No fever. No visual changes. She denies underlying headache, hearing changes, sore throat or sinus symptoms.    Past Medical History  Diagnosis Date  . Cardiovascular disease 1997    Quadruple Bypass Surgery 1997  . Hyperlipidemia   . Hypertension   . 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    . IFG (impaired fasting glucose)   . Tinnitus   . Colon polyps   . Osteopenia   . Hyperparathyroidism 03/2012    Left Superior Adenoma    Past Surgical History  Procedure Laterality Date  . Coronary artery bypass graft  1997    Quadruple   . Breast surgery  2013    She went in for breast reduction and the pathology report showed breast cancer.    . Cholecystectomy  1990   Family History  Problem Relation Age of Onset  . Stroke Mother   . Diabetes Mother   . Heart disease Sister   . Diabetes Sister   . Cancer Sister   . Heart disease Brother    History  Substance Use Topics  . Smoking status: Former Smoker -- 0.50 packs/day for 20 years    Types: Cigarettes  . Smokeless tobacco: Not on file  . Alcohol Use: No   OB History   Grav Para Term Preterm Abortions TAB SAB Ect Mult Living                  Review of Systems  Constitutional: Negative for fever and chills.  HENT: Negative for facial swelling and neck pain.   Eyes: Negative.  Negative for visual disturbance.  Respiratory: Negative.  Negative for shortness of breath.   Cardiovascular: Negative.  Negative for chest pain.  Gastrointestinal: Negative.  Negative for nausea.  Skin: Negative.  Negative for rash.  Neurological: Positive for headaches. Negative for weakness.  Psychiatric/Behavioral: Negative for confusion.    Allergies  Ace inhibitors and Codeine  Home Medications   Current Outpatient Rx  Name  Route  Sig  Dispense  Refill  . atenolol-chlorthalidone (TENORETIC) 50-25 MG per tablet               . benzonatate (TESSALON PERLES) 100 MG capsule      Take 1-2 capsules up to 3 times per day for cough   30 capsule   1   . DULoxetine (CYMBALTA) 30 MG capsule      Take 1 capsule daily with supper   30 capsule   1   . estradiol (VAGIFEM) 25 MCG vaginal tablet   Vaginal   Place 1 tablet (25 mcg total) vaginally 2 (two) times a week.   8 tablet   11   . FLUoxetine (PROZAC) 20  MG capsule   Oral   Take 1 capsule (20 mg total) by mouth daily.   30 capsule   2   . hydrOXYzine (ATARAX/VISTARIL) 25 MG tablet      Take 1/2 - 1 tab every 4-6 hours for itching.   30 tablet   11   . Memantine HCl ER (NAMENDA XR) 21 MG CP24   Oral   Take 1 capsule by mouth daily.   30 capsule   11   . NEXIUM 40 MG capsule               . progesterone (PROMETRIUM) 100 MG capsule   Oral   Take 1 capsule (100 mg total) by mouth at bedtime.   30 capsule   11   . promethazine-dextromethorphan (PROMETHAZINE-DM) 6.25-15 MG/5ML syrup      Take 1 teaspoon up to 4 times per day for nausea/cough   120 mL   1   . valsartan (DIOVAN) 160 MG tablet   Oral   Take 160 mg by mouth daily. Take 0.5 to 1 tab po daily.          BP 136/68  Pulse 68  Temp(Src) 97.4 F (36.3 C) (Oral)  Resp 18  SpO2 100% Physical  Exam  Constitutional: She is oriented to person, place, and time. She appears well-developed and well-nourished.  HENT:  Head: Normocephalic.  Eyes: Pupils are equal, round, and reactive to light.  Neck: Normal range of motion. Neck supple.  Cardiovascular: Normal rate and regular rhythm.   No temporal or carotid bruits.   Pulmonary/Chest: Effort normal and breath sounds normal.  Abdominal: Soft. Bowel sounds are normal. There is no tenderness. There is no rebound and no guarding.  Musculoskeletal: Normal range of motion. She exhibits no edema.  Neurological: She is alert and oriented to person, place, and time. She has normal strength and normal reflexes. No sensory deficit. She displays a negative Romberg sign. Coordination normal.  Skin: Skin is warm and dry. No rash noted.  Psychiatric: She has a normal mood and affect.    ED Course  Procedures (including critical care time) Labs Review Labs Reviewed - No data to display Imaging Review No results found.  MDM  No diagnosis found. 1. Trigeminal neuralgia  No persistent temporal headache or visual changes. Does not follow the pattern of arteritis. Pattern suggests trigeminal neuralgia but patient is vague about symptom type and feels "it may be switching to the left side, but can't tell".  She is better with Ultram and requests discharge home. Attempted to contact Dr. Alberteen Sam unsuccessful and patient stable for discharge. Will plan to have her see PCP tomorrow for recheck.     Arnoldo Hooker, PA-C 04/15/13 1419

## 2013-04-15 NOTE — ED Notes (Signed)
Pt reports intermittent, sharp pains in temporal region x 2 days unrelieved after taking OTC Tylenol

## 2013-04-16 NOTE — ED Provider Notes (Signed)
Medical screening examination/treatment/procedure(s) were conducted as a shared visit with non-physician practitioner(s) and myself.  I personally evaluated the patient during the encounter Patient presenting with atypical facial pain not suggestive of temporal arteritis that started several days ago. Neurovascularly intact. No signs of zoster. Possibility for trigeminal neuralgia however no history of same. Will have patient follow up with PCP tomorrow for followup and possible brain MRI  [name] Sprout, MD 04/16/13 [id number]

## 2013-04-21 ENCOUNTER — Encounter: Payer: Self-pay | Admitting: Family Medicine

## 2013-04-21 ENCOUNTER — Ambulatory Visit (INDEPENDENT_AMBULATORY_CARE_PROVIDER_SITE_OTHER): Payer: Medicare Other | Admitting: Family Medicine

## 2013-04-21 VITALS — BP 97/65 | HR 48 | Resp 16 | Ht 62.5 in | Wt 134.0 lb

## 2013-04-21 DIAGNOSIS — R519 Headache, unspecified: Secondary | ICD-10-CM | POA: Insufficient documentation

## 2013-04-21 DIAGNOSIS — E213 Hyperparathyroidism, unspecified: Secondary | ICD-10-CM

## 2013-04-21 DIAGNOSIS — R102 Pelvic and perineal pain: Secondary | ICD-10-CM

## 2013-04-21 DIAGNOSIS — R51 Headache: Secondary | ICD-10-CM | POA: Insufficient documentation

## 2013-04-21 DIAGNOSIS — Z23 Encounter for immunization: Secondary | ICD-10-CM | POA: Insufficient documentation

## 2013-04-21 DIAGNOSIS — N949 Unspecified condition associated with female genital organs and menstrual cycle: Secondary | ICD-10-CM

## 2013-04-21 DIAGNOSIS — R413 Other amnesia: Secondary | ICD-10-CM

## 2013-04-21 MED ORDER — TRAMADOL HCL 50 MG PO TABS: ORAL_TABLET | ORAL | Status: DC

## 2013-04-21 MED ORDER — MEMANTINE HCL 10 MG PO TABS: 10.0000 mg | ORAL_TABLET | Freq: Two times a day (BID) | ORAL | Status: DC

## 2013-04-21 NOTE — Assessment & Plan Note (Signed)
The patient confirmed that they are not allergic to eggs and have never had a bad reaction with the flu shot in the past.  The vaccination was given without difficulty.   

## 2013-04-21 NOTE — Patient Instructions (Addendum)
1)  Headache/General Pain - You can take 1/2 - 1 of the tramadol 50 mg plus some Tylenol for pain.  2)  Memory - Start on the twice a day version of Namenda and see how you do with this.  If you can get this one then take it 1-2 times depending on how you need it.    Headaches, Frequently Asked Questions MIGRAINE HEADACHES Q: What is migraine? What causes it? How can I treat it? A: Generally, migraine headaches begin as a dull ache. Then they develop into a constant, throbbing, and pulsating pain. You may experience pain at the temples. You may experience pain at the front or back of one or both sides of the head. The pain is usually accompanied by a combination of:  Nausea.  Vomiting.  Sensitivity to light and noise. Some people (about 15%) experience an aura (see below) before an attack. The cause of migraine is believed to be chemical reactions in the brain. Treatment for migraine may include over-the-counter or prescription medications. It may also include self-help techniques. These include relaxation training and biofeedback.  Q: What is an aura? A: About 15% of people with migraine get an "aura". This is a sign of neurological symptoms that occur before a migraine headache. You may see wavy or jagged lines, dots, or flashing lights. You might experience tunnel vision or blind spots in one or both eyes. The aura can include visual or auditory hallucinations (something imagined). It may include disruptions in smell (such as strange odors), taste or touch. Other symptoms include:  Numbness.  A "pins and needles" sensation.  Difficulty in recalling or speaking the correct word. These neurological events may last as long as 60 minutes. These symptoms will fade as the headache begins. Q: What is a trigger? A: Certain physical or environmental factors can lead to or "trigger" a migraine. These include:  Foods.  Hormonal changes.  Weather.  Stress. It is important to remember that  triggers are different for everyone. To help prevent migraine attacks, you need to figure out which triggers affect you. Keep a headache diary. This is a good way to track triggers. The diary will help you talk to your healthcare professional about your condition. Q: Does weather affect migraines? A: Bright sunshine, hot, humid conditions, and drastic changes in barometric pressure may lead to, or "trigger," a migraine attack in some people. But studies have shown that weather does not act as a trigger for everyone with migraines. Q: What is the link between migraine and hormones? A: Hormones start and regulate many of your body's functions. Hormones keep your body in balance within a constantly changing environment. The levels of hormones in your body are unbalanced at times. Examples are during menstruation, pregnancy, or menopause. That can lead to a migraine attack. In fact, about three quarters of all women with migraine report that their attacks are related to the menstrual cycle.  Q: Is there an increased risk of stroke for migraine sufferers? A: The likelihood of a migraine attack causing a stroke is very remote. That is not to say that migraine sufferers cannot have a stroke associated with their migraines. In persons under age 17, the most common associated factor for stroke is migraine headache. But over the course of a person's normal life span, the occurrence of migraine headache may actually be associated with a reduced risk of dying from cerebrovascular disease due to stroke.  Q: What are acute medications for migraine? A: Acute medications  are used to treat the pain of the headache after it has started. Examples over-the-counter medications, NSAIDs, ergots, and triptans.  Q: What are the triptans? A: Triptans are the newest class of abortive medications. They are specifically targeted to treat migraine. Triptans are vasoconstrictors. They moderate some chemical reactions in the brain. The  triptans work on receptors in your brain. Triptans help to restore the balance of a neurotransmitter called serotonin. Fluctuations in levels of serotonin are thought to be a main cause of migraine.  Q: Are over-the-counter medications for migraine effective? A: Over-the-counter, or "OTC," medications may be effective in relieving mild to moderate pain and associated symptoms of migraine. But you should see your caregiver before beginning any treatment regimen for migraine.  Q: What are preventive medications for migraine? A: Preventive medications for migraine are sometimes referred to as "prophylactic" treatments. They are used to reduce the frequency, severity, and length of migraine attacks. Examples of preventive medications include antiepileptic medications, antidepressants, beta-blockers, calcium channel blockers, and NSAIDs (nonsteroidal anti-inflammatory drugs). Q: Why are anticonvulsants used to treat migraine? A: During the past few years, there has been an increased interest in antiepileptic drugs for the prevention of migraine. They are sometimes referred to as "anticonvulsants". Both epilepsy and migraine may be caused by similar reactions in the brain.  Q: Why are antidepressants used to treat migraine? A: Antidepressants are typically used to treat people with depression. They may reduce migraine frequency by regulating chemical levels, such as serotonin, in the brain.  Q: What alternative therapies are used to treat migraine? A: The term "alternative therapies" is often used to describe treatments considered outside the scope of conventional Western medicine. Examples of alternative therapy include acupuncture, acupressure, and yoga. Another common alternative treatment is herbal therapy. Some herbs are believed to relieve headache pain. Always discuss alternative therapies with your caregiver before proceeding. Some herbal products contain arsenic and other toxins. TENSION HEADACHES Q:  What is a tension-type headache? What causes it? How can I treat it? A: Tension-type headaches occur randomly. They are often the result of temporary stress, anxiety, fatigue, or anger. Symptoms include soreness in your temples, a tightening band-like sensation around your head (a "vice-like" ache). Symptoms can also include a pulling feeling, pressure sensations, and contracting head and neck muscles. The headache begins in your forehead, temples, or the back of your head and neck. Treatment for tension-type headache may include over-the-counter or prescription medications. Treatment may also include self-help techniques such as relaxation training and biofeedback. CLUSTER HEADACHES Q: What is a cluster headache? What causes it? How can I treat it? A: Cluster headache gets its name because the attacks come in groups. The pain arrives with little, if any, warning. It is usually on one side of the head. A tearing or bloodshot eye and a runny nose on the same side of the headache may also accompany the pain. Cluster headaches are believed to be caused by chemical reactions in the brain. They have been described as the most severe and intense of any headache type. Treatment for cluster headache includes prescription medication and oxygen. SINUS HEADACHES Q: What is a sinus headache? What causes it? How can I treat it? A: When a cavity in the bones of the face and skull (a sinus) becomes inflamed, the inflammation will cause localized pain. This condition is usually the result of an allergic reaction, a tumor, or an infection. If your headache is caused by a sinus blockage, such as an infection, you  will probably have a fever. An x-ray will confirm a sinus blockage. Your caregiver's treatment might include antibiotics for the infection, as well as antihistamines or decongestants.  REBOUND HEADACHES Q: What is a rebound headache? What causes it? How can I treat it? A: A pattern of taking acute headache  medications too often can lead to a condition known as "rebound headache." A pattern of taking too much headache medication includes taking it more than 2 days per week or in excessive amounts. That means more than the label or a caregiver advises. With rebound headaches, your medications not only stop relieving pain, they actually begin to cause headaches. Doctors treat rebound headache by tapering the medication that is being overused. Sometimes your caregiver will gradually substitute a different type of treatment or medication. Stopping may be a challenge. Regularly overusing a medication increases the potential for serious side effects. Consult a caregiver if you regularly use headache medications more than 2 days per week or more than the label advises. ADDITIONAL QUESTIONS AND ANSWERS Q: What is biofeedback? A: Biofeedback is a self-help treatment. Biofeedback uses special equipment to monitor your body's involuntary physical responses. Biofeedback monitors:  Breathing.  Pulse.  Heart rate.  Temperature.  Muscle tension.  Brain activity. Biofeedback helps you refine and perfect your relaxation exercises. You learn to control the physical responses that are related to stress. Once the technique has been mastered, you do not need the equipment any more. Q: Are headaches hereditary? A: Four out of five (80%) of people that suffer report a family history of migraine. Scientists are not sure if this is genetic or a family predisposition. Despite the uncertainty, a child has a 50% chance of having migraine if one parent suffers. The child has a 75% chance if both parents suffer.  Q: Can children get headaches? A: By the time they reach high school, most young people have experienced some type of headache. Many safe and effective approaches or medications can prevent a headache from occurring or stop it after it has begun.  Q: What type of doctor should I see to diagnose and treat my  headache? A: Start with your primary caregiver. Discuss his or her experience and approach to headaches. Discuss methods of classification, diagnosis, and treatment. Your caregiver may decide to recommend you to a headache specialist, depending upon your symptoms or other physical conditions. Having diabetes, allergies, etc., may require a more comprehensive and inclusive approach to your headache. The National Headache Foundation will provide, upon request, a list of [facility] physician members in your state.                                                     Document Released: 10/06/2003 Document Revised: 10/08/2011 Document Reviewed: 03/15/2008 [facility] Patient Information 2014 [place], [place].

## 2013-04-21 NOTE — Progress Notes (Signed)
Subjective:    Patient ID: Margaret Moore, female    DOB: 1941-07-10, 72 y.o.   MRN: [id number]  HPI  Valissa is here today with her husband (Al) to discuss a couple of issues:    1)  Headache/Facial Pain:  She was seen at the [facility] ER on 04/15/13  for facial pain and headache.  She was told that she has trigeminal neuralgia and was given tramadol for her pain which she says resolved the pain.  She was instructed to follow up with her PCP.  She says that she does not feel that her problem is trigeminal neuralgia.    2)  Memory:  Both Tosha and her husband feel that she did better and they both noticed an improvement in her memory when she was on Namenda.  She has not been able to get it filled because it is on back order.  She would like to remain on it.    3)  Vaginal Symptoms:  She followed up with Dr. Alyce Pagan after her last visit with [place] regarding her genital symptoms.  He could not find anything wrong so he sent her to [facility] for an evaluation.  She says that they told her she was perfectly fine. Before she went to [facility] she had started putting Vaseline to her bottom which she says gave her great relief. The doctor at [facility] recommended that she try Crisco.  She did this and says that it irritated her so she is back to the Vaseline which she feels has worked very well for her.    4)  Elevated PTH:  Leonie says that she followed up with Dr. Claudine Mouton and he told her that her neck was fine and that she did not need to do anything more with this at this time.     Review of Systems  Constitutional: Negative.   HENT: Negative.   Eyes: Negative.   Respiratory: Negative.   Cardiovascular: Negative.   Gastrointestinal: Negative.   Endocrine: Negative.   Genitourinary: Negative for vaginal discharge and vaginal pain.  Musculoskeletal: Negative.   Skin: Negative.   Allergic/Immunologic: Negative.   Neurological: Negative for headaches.  Hematological: Negative.    Psychiatric/Behavioral:       Increased memory issues since stopping Namenda    Past Medical History  Diagnosis Date  . Cardiovascular disease 1997    Quadruple Bypass Surgery 1997  . Hyperlipidemia   . Hypertension   . 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    . IFG (impaired fasting glucose)   . Tinnitus   . Colon polyps   . Osteopenia   . Hyperparathyroidism 03/2012    Left Superior Adenoma      Family History  Problem Relation Age of Onset  . Stroke Mother   . Diabetes Mother   . Heart disease Sister   . Diabetes Sister   . Cancer Sister   . Heart disease Brother      History   Social History Narrative   Marital Status: Married Advertising account planner)   Children:  Daughter (Deceased) Step-Children (Sons - 3; Daughter -1) [name] (She lives in [place])   Pets: Cats   Living Situation: Lives with  spouse   Occupation: Retired Facilities manager (Disabled)   Education: 9 th Grade    Tobacco Use/Exposure:  She smoked 1/2 ppd for > 20 years.  She quit smoking 20 years  ago.     Alcohol Use:  None   Drug Use:  None   Diet:  Regular Low Sodium   Exercise:  Walking   Hobbies: Gardening, Flowers                        Objective:   Physical Exam  Vitals reviewed. Constitutional: She is oriented to person, place, and time. She appears well-developed and well-nourished. No distress.  HENT:  She denies pain in the TMJ region.    Eyes: No scleral icterus.  Cardiovascular: Normal rate and regular rhythm.   Pulmonary/Chest: Effort normal and breath sounds normal.    Musculoskeletal: She exhibits no edema and no tenderness.  Neurological: She is alert and oriented to person, place, and time.  Skin: Skin is warm and dry. No rash noted.  Psychiatric: She has a normal mood and affect. Her behavior is normal. Judgment and thought content normal.      Assessment & Plan:

## 2013-04-21 NOTE — Assessment & Plan Note (Signed)
This is the first time in many visits that she was not complaining of vaginal symptoms. I encouraged her to continue using her Vaseline!!

## 2013-04-21 NOTE — Assessment & Plan Note (Signed)
She was given another sample pack of the original Namenda.  She will this for a month and hopefully will be able to get it filled.

## 2013-04-22 NOTE — Assessment & Plan Note (Signed)
She followed up with Dr. Claudine Mouton and was told that he could not find a problem in her neck.

## 2013-04-22 NOTE — Assessment & Plan Note (Addendum)
Kya read the handout that she was given on Trigeminal Neuralgia and she says that she does not feel that she has this.  She just thinks that she had a bad headache.  The tramadol did help so she was given a refill for it.

## 2013-06-15 ENCOUNTER — Encounter: Payer: Self-pay | Admitting: *Deleted

## 2013-06-22 ENCOUNTER — Encounter: Payer: Self-pay | Admitting: *Deleted

## 2013-08-06 ENCOUNTER — Other Ambulatory Visit: Payer: Self-pay | Admitting: Family Medicine

## 2013-08-06 DIAGNOSIS — K219 Gastro-esophageal reflux disease without esophagitis: Secondary | ICD-10-CM

## 2013-08-06 MED ORDER — OMEPRAZOLE 40 MG PO CPDR: 40.0000 mg | DELAYED_RELEASE_CAPSULE | Freq: Every day | ORAL | Status: DC

## 2013-08-24 ENCOUNTER — Encounter: Payer: Self-pay | Admitting: Family Medicine

## 2013-08-24 ENCOUNTER — Ambulatory Visit (INDEPENDENT_AMBULATORY_CARE_PROVIDER_SITE_OTHER): Payer: Medicare Other | Admitting: Family Medicine

## 2013-08-24 VITALS — BP 151/83 | HR 51 | Resp 16 | Ht 62.0 in | Wt 139.0 lb

## 2013-08-24 DIAGNOSIS — L989 Disorder of the skin and subcutaneous tissue, unspecified: Secondary | ICD-10-CM

## 2013-08-24 DIAGNOSIS — L293 Anogenital pruritus, unspecified: Secondary | ICD-10-CM

## 2013-08-24 DIAGNOSIS — N898 Other specified noninflammatory disorders of vagina: Secondary | ICD-10-CM

## 2013-08-24 DIAGNOSIS — R238 Other skin changes: Secondary | ICD-10-CM

## 2013-08-24 DIAGNOSIS — N760 Acute vaginitis: Secondary | ICD-10-CM

## 2013-08-24 MED ORDER — METRONIDAZOLE 500 MG PO TABS: 500.0000 mg | ORAL_TABLET | Freq: Two times a day (BID) | ORAL | Status: AC

## 2013-08-24 MED ORDER — CLINDAMYCIN PHOSPHATE 2 % VA CREA: TOPICAL_CREAM | VAGINAL | Status: DC

## 2013-08-24 MED ORDER — FLUCONAZOLE 150 MG PO TABS: 150.0000 mg | ORAL_TABLET | Freq: Once | ORAL | Status: DC

## 2013-08-24 NOTE — Progress Notes (Signed)
Subjective:    Patient ID: Margaret Moore, female    DOB: [birth year], 73 y.o.   MRN: [id number]  HPI  Margaret Moore is back complaining of vaginal itching. This is a chronic problem for her and has worsened over the past week.  She describes the itching as being mild-moderate in nature and she thinks that it may be due to the fact that she "sweats excessively" in her vagina or when she changes her soap.  She has tried OTC Vagisil which has given her a little relief.      Review of Systems  Genitourinary: Positive for vaginal discharge. Negative for dysuria, vaginal pain and pelvic pain.       Vaginal itching   All other systems reviewed and are negative.     Past Medical History  Diagnosis Date  . Cardiovascular disease 1997    Quadruple Bypass Surgery 1997  . Hyperlipidemia   . Hypertension   . 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    . IFG (impaired fasting glucose)   . Tinnitus   . Colon polyps   . Osteopenia   . Hyperparathyroidism 03/2012    Left Superior Adenoma      Past Surgical History  Procedure Laterality Date  . Coronary artery bypass graft  1997    Quadruple   . Breast surgery  2013    She went in for breast reduction and the pathology report showed breast cancer.    . Cholecystectomy  1990     History   Social History Narrative   Marital Status: Married Dance movement psychotherapist)   Children:  Daughter (Deceased) Step-Children (Sons - 3; Daughter -1) Charlott Moore (She lives in [place])   Pets: Cats   Living Situation: Lives with  spouse   Occupation: Retired Land (Disabled)   Education: 9 th Grade    Tobacco Use/Exposure:  She smoked 1/2 ppd for > 20 years.  She quit smoking 20 years ago.     Alcohol Use:  None   Drug Use:  None   Diet:  Regular Low Sodium   Exercise:  Walking   Hobbies: Gardening, Flowers                       Family History  Problem Relation Age of  Onset  . Stroke Mother   . Diabetes Mother   . Heart disease Sister   . Diabetes Sister   . Cancer Sister   . Colon cancer Sister   . Heart disease Brother   . Hypertension Brother   . Alcohol abuse Father   . Asthma Sister      Current Outpatient Prescriptions on File Prior to Visit  Medication Sig Dispense Refill  . atenolol-chlorthalidone (TENORETIC) 50-25 MG per tablet       . Memantine HCl ER (NAMENDA XR) 21 MG CP24 Take 1 capsule by mouth daily.  30 capsule  11  . NEXIUM 40 MG capsule       . traMADol (ULTRAM) 50 MG tablet Take 1/2 - 1 tab up to BID as needed for pain  60 tablet  1  . valsartan (DIOVAN) 160 MG tablet Take 160 mg by mouth daily. Take 0.5 to 1 tab po daily.      . DULoxetine (CYMBALTA) 30 MG capsule Take 1 capsule daily with supper  30 capsule  1  . estradiol (VAGIFEM)  25 MCG vaginal tablet Place 1 tablet (25 mcg total) vaginally 2 (two) times a week.  8 tablet  11   No current facility-administered medications on file prior to visit.     Allergies  Allergen Reactions  . Ace Inhibitors Cough  . Codeine Itching     Immunization History  Administered Date(s) Administered  . Influenza,inj,Quad PF,36+ Mos 04/21/2013  . Pneumococcal-Unspecified 04/20/2008  . Tdap 10/08/2011  . Zoster 03/29/2008       Objective:   Physical Exam  Nursing note and vitals reviewed. Constitutional: She appears well-developed.  Cardiovascular: Normal rate, regular rhythm and normal heart sounds.   Pulmonary/Chest: Effort normal and breath sounds normal.  Genitourinary: Vaginal discharge found.  Neurological: She is alert.  Psychiatric: She has a normal mood and affect. Her behavior is normal. Judgment and thought content normal.      Assessment & Plan:    [name] was seen today for vaginal itching.  Diagnoses and associated orders for this visit:  Vaginal itching Comments: She is to be careful with changing soaps and is to limit what she uses in her genital  area.  She will apply the clobetasol cream as needed.    Vaginitis and vulvovaginitis, unspecified - metroNIDAZOLE (FLAGYL) 500 MG tablet; Take 1 tablet (500 mg total) by mouth 2 (two) times daily. -     fluconazole (DIFLUCAN) 150 MG tablet; Take 1 tablet (150 mg total) by mouth once. - clindamycin (CLEOCIN) 2 % vaginal cream; Insert one applicator into vagina at night for 3 nights  Skin irritation - silver sulfADIAZINE (SILVADENE) 1 % cream; Apply topically 2 (two) times daily.

## 2013-08-24 NOTE — Patient Instructions (Signed)
1)  Vaginal Infection - Take the Flagyl (metronidazole) 500 mg tabs twice a day for 7 days plus 1 Diflucan (x 1 day); If after doing this you still have issues, you can try the cleocin cream at night for 3 days.    2)  Itching - Apply the clobetasol cream 1-2 times per day for itching.    Vaginitis Vaginitis is an inflammation of the vagina. It is most often caused by a change in the normal balance of the bacteria and yeast that live in the vagina. This change in balance causes an overgrowth of certain bacteria or yeast, which causes the inflammation. There are different types of vaginitis, but the most common types are:  Bacterial vaginosis.  Yeast infection (candidiasis).  Trichomoniasis vaginitis. This is a sexually transmitted infection (STI).  Viral vaginitis.  Atropic vaginitis.  Allergic vaginitis. CAUSES  The cause depends on the type of vaginitis. Vaginitis can be caused by:  Bacteria (bacterial vaginosis).  Yeast (yeast infection).  A parasite (trichomoniasis vaginitis)  A virus (viral vaginitis).  Low hormone levels (atrophic vaginitis). Low hormone levels can occur during pregnancy, breastfeeding, or after menopause.  Irritants, such as bubble baths, scented tampons, and feminine sprays (allergic vaginitis). Other factors can change the normal balance of the yeast and bacteria that live in the vagina. These include:  Antibiotic medicines.  Poor hygiene.  Diaphragms, vaginal sponges, spermicides, birth control pills, and intrauterine devices (IUD).  Sexual intercourse.  Infection.  Uncontrolled diabetes.  A weakened immune system. SYMPTOMS  Symptoms can vary depending on the cause of the vaginitis. Common symptoms include:  Abnormal vaginal discharge.  The discharge is white, gray, or yellow with bacterial vaginosis.  The discharge is thick, white, and cheesy with a yeast infection.  The discharge is frothy and yellow or greenish with  trichomoniasis.  A bad vaginal odor.  The odor is fishy with bacterial vaginosis.  Vaginal itching, pain, or swelling.  Painful intercourse.  Pain or burning when urinating. Sometimes, there are no symptoms. TREATMENT  Treatment will vary depending on the type of infection.   Bacterial vaginosis and trichomoniasis are often treated with antibiotic creams or pills.  Yeast infections are often treated with antifungal medicines, such as vaginal creams or suppositories.  Viral vaginitis has no cure, but symptoms can be treated with medicines that relieve discomfort. Your sexual partner should be treated as well.  Atrophic vaginitis may be treated with an estrogen cream, pill, suppository, or vaginal ring. If vaginal dryness occurs, lubricants and moisturizing creams may help. You may be told to avoid scented soaps, sprays, or douches.  Allergic vaginitis treatment involves quitting the use of the product that is causing the problem. Vaginal creams can be used to treat the symptoms. HOME CARE INSTRUCTIONS   Take all medicines as directed by your caregiver.  Keep your genital area clean and dry. Avoid soap and only rinse the area with water.  Avoid douching. It can remove the healthy bacteria in the vagina.  Do not use tampons or have sexual intercourse until your vaginitis has been treated. Use sanitary pads while you have vaginitis.  Wipe from front to back. This avoids the spread of bacteria from the rectum to the vagina.  Let air reach your genital area.  Wear cotton underwear to decrease moisture buildup.  Avoid wearing underwear while you sleep until your vaginitis is gone.  Avoid tight pants and underwear or nylons without a cotton panel.  Take off wet clothing (especially bathing  suits) as soon as possible.  Use mild, non-scented products. Avoid using irritants, such as:  Scented feminine sprays.  Fabric softeners.  Scented detergents.  Scented  tampons.  Scented soaps or bubble baths.  Practice safe sex and use condoms. Condoms may prevent the spread of trichomoniasis and viral vaginitis. SEEK MEDICAL CARE IF:   You have abdominal pain.  You have a fever or persistent symptoms for more than 2 3 days.  You have a fever and your symptoms suddenly get worse. Document Released: 05/13/2007 Document Revised: 04/09/2012 Document Reviewed: 12/27/2011 [facility] Patient Information 2014 [place].

## 2013-09-01 MED ORDER — SILVER SULFADIAZINE 1 % EX CREA: TOPICAL_CREAM | Freq: Two times a day (BID) | CUTANEOUS | Status: DC

## 2013-10-01 ENCOUNTER — Encounter: Payer: Self-pay | Admitting: Family Medicine

## 2013-10-01 ENCOUNTER — Ambulatory Visit (INDEPENDENT_AMBULATORY_CARE_PROVIDER_SITE_OTHER): Payer: Medicare Other | Admitting: Family Medicine

## 2013-10-01 VITALS — BP 148/85 | HR 53 | Resp 16 | Wt 136.0 lb

## 2013-10-01 DIAGNOSIS — N760 Acute vaginitis: Secondary | ICD-10-CM

## 2013-10-01 MED ORDER — ESTRADIOL 10 MCG VA TABS: 1.0000 | ORAL_TABLET | VAGINAL | Status: DC

## 2013-10-01 NOTE — Patient Instructions (Signed)
1)  Vaginitis - Insert the estrogen pill 1-2 times per week; You may also want to take an acidophilus pill daily.  Try wearing all cotton underwear.  You may want to try DREFT.      Atrophic Vaginitis Atrophic vaginitis is a problem of low levels of estrogen in women. This problem can happen at any age. It is most common in women who have gone through menopause ("the change").  HOW WILL I KNOW IF I HAVE THIS PROBLEM? You may have:  Trouble with peeing (urinating), such as:  Going to the bathroom often.  A hard time holding your pee until you reach a bathroom.  Leaking pee.  Having pain when you pee.  Itching or a burning feeling.  Vaginal bleeding and spotting.  Pain during sex.  Dryness of the vagina.  A yellow, bad-smelling fluid (discharge) coming from the vagina. HOW WILL MY DOCTOR CHECK FOR THIS PROBLEM?  During your exam, your doctor will likely find the problem.  If there is a vaginal fluid, it may be checked for infection. HOW WILL THIS PROBLEM BE TREATED? Keep the vulvar skin as clean as possible. Moisturizers and lubricants can help with some of the symptoms. Estrogen replacement can help. There are 2 ways to take estrogen:  Systemic estrogen gets estrogen to your whole body. It takes many weeks or months before the symptoms get better.  You take an estrogen pill.  You use a skin patch. This is a patch that you put on your skin.  If you still have your uterus, your doctor may ask you to take a hormone. Talk to your doctor about the right medicine for you.  Estrogen cream.  This puts estrogen only at the part of your body where you apply it. The cream is put into the vagina or put on the vulvar skin. For some women, estrogen cream works faster than pills or the patch. CAN ALL WOMEN WITH THIS PROBLEM USE ESTROGEN? No. Women with certain types of cancer, liver problems, or problems with blood clots should not take estrogen. Your doctor can help you decide the best  treatment for your symptoms. Document Released: 01/02/2008 Document Revised: 10/08/2011 Document Reviewed: 01/02/2008 [facility] Patient Information 2014 [place], [place].

## 2013-10-01 NOTE — Progress Notes (Signed)
Subjective:    Patient ID: Margaret Moore, female    DOB: 1941-07-09, 73 y.o.   MRN: [id number]   HPI  Margaret Moore is here today to discuss her vaginal problems.  She had vaginal itching that developed two days ago.  She changed her soap and applied Vaseline to her vaginal area and her symptoms resolved.  She wants to understand why this problem keeps happening.  She is still sexually active but does not feel that this is the cause of her problem. She does not have sexual intercourse often due to this problem. She has clipped her pubic hairs but that did not help to stop her vaginal symptoms.  Her GYN (Dr Irene Shipper) recently retired and she was not referred to anyone else.     Review of Systems  Genitourinary:       Vaginal Itchiness (Resolved)     Past Medical History  Diagnosis Date  . Cardiovascular disease 1997    Quadruple Bypass Surgery 1997  . Hyperlipidemia   . Hypertension   . 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    . IFG (impaired fasting glucose)   . Tinnitus   . Colon polyps   . Osteopenia   . Hyperparathyroidism 03/2012    Left Superior Adenoma      Past Surgical History  Procedure Laterality Date  . Coronary artery bypass graft  1997    Quadruple   . Breast surgery  2013    She went in for breast reduction and the pathology report showed breast cancer.    . Cholecystectomy  1990     History   Social History Narrative   Marital Status: Married Dance movement psychotherapist)   Children:  Daughter (Deceased) Step-Children (Sons - 3; Daughter -1) Margaret Moore (She lives in [place])   Pets: Cats   Living Situation: Lives with  spouse   Occupation: Retired Land (Disabled)   Education: 9 th Grade    Tobacco Use/Exposure:  She smoked 1/2 ppd for > 20 years.  She quit smoking 20 years ago.     Alcohol Use:  None   Drug Use:  None   Diet:  Regular Low Sodium   Exercise:  Walking   Hobbies:  Gardening, Flowers                       Family History  Problem Relation Age of Onset  . Stroke Mother   . Diabetes Mother   . Heart disease Sister   . Diabetes Sister   . Cancer Sister   . Colon cancer Sister   . Heart disease Brother   . Hypertension Brother   . Alcohol abuse Father   . Asthma Sister      Current Outpatient Prescriptions on File Prior to Visit  Medication Sig Dispense Refill  . aspirin (ASPIRIN EC) 81 MG EC tablet Take 81 mg by mouth daily. Swallow whole.      Marland Kitchen atenolol-chlorthalidone (TENORETIC) 50-25 MG per tablet       . clindamycin (CLEOCIN) 2 % vaginal cream Insert one applicator into vagina at night for 3 nights  40 g  0  . DULoxetine (CYMBALTA) 30 MG capsule Take 1 capsule daily with supper  30 capsule  1  . Memantine HCl ER (NAMENDA XR) 21 MG CP24 Take 1 capsule by mouth daily.  30 capsule  11  .  NEXIUM 40 MG capsule       . valsartan (DIOVAN) 160 MG tablet Take 160 mg by mouth daily. Take 0.5 to 1 tab po daily.      Marland Kitchen estradiol (VAGIFEM) 25 MCG vaginal tablet Place 1 tablet (25 mcg total) vaginally 2 (two) times a week.  8 tablet  11  . traMADol (ULTRAM) 50 MG tablet Take 1/2 - 1 tab up to BID as needed for pain  60 tablet  1   No current facility-administered medications on file prior to visit.     Allergies  Allergen Reactions  . Ace Inhibitors Cough  . Codeine Itching     Immunization History  Administered Date(s) Administered  . Influenza,inj,Quad PF,36+ Mos 04/21/2013  . Pneumococcal-Unspecified 04/20/2008  . Tdap 10/08/2011  . Zoster 03/29/2008      Objective:   Physical Exam  Genitourinary: Vagina normal. No vaginal discharge found.      Assessment & Plan:    Margaret Moore was seen today for vaginal itching.  Diagnoses and associated orders for this visit:  Vaginitis and vulvovaginitis - Estradiol 10 MCG TABS vaginal tablet; Place 1 tablet (10 mcg total) vaginally 2 (two) times a week.

## 2013-10-19 DIAGNOSIS — N760 Acute vaginitis: Secondary | ICD-10-CM | POA: Insufficient documentation

## 2013-10-19 DIAGNOSIS — R238 Other skin changes: Secondary | ICD-10-CM | POA: Insufficient documentation

## 2013-10-19 DIAGNOSIS — N898 Other specified noninflammatory disorders of vagina: Secondary | ICD-10-CM | POA: Insufficient documentation

## 2013-12-10 ENCOUNTER — Other Ambulatory Visit: Payer: Self-pay | Admitting: *Deleted

## 2013-12-10 DIAGNOSIS — Z0000 Encounter for general adult medical examination without abnormal findings: Secondary | ICD-10-CM

## 2013-12-10 DIAGNOSIS — I10 Essential (primary) hypertension: Secondary | ICD-10-CM

## 2013-12-10 DIAGNOSIS — R5381 Other malaise: Secondary | ICD-10-CM

## 2013-12-10 DIAGNOSIS — R5383 Other fatigue: Secondary | ICD-10-CM

## 2013-12-10 DIAGNOSIS — E559 Vitamin D deficiency, unspecified: Secondary | ICD-10-CM

## 2013-12-11 ENCOUNTER — Other Ambulatory Visit: Payer: Medicare Other

## 2013-12-11 LAB — CBC WITH DIFFERENTIAL/PLATELET
Basophils Absolute: 0 10*3/uL (ref 0.0–0.1)
Basophils Relative: 1 % (ref 0–1)
Eosinophils Absolute: 0.1 10*3/uL (ref 0.0–0.7)
Eosinophils Relative: 3 % (ref 0–5)
HCT: 35.6 % — ABNORMAL LOW (ref 36.0–46.0)
Hemoglobin: 11.9 g/dL — ABNORMAL LOW (ref 12.0–15.0)
Lymphocytes Relative: 40 % (ref 12–46)
Lymphs Abs: 1.9 10*3/uL (ref 0.7–4.0)
MCH: 28.3 pg (ref 26.0–34.0)
MCHC: 33.4 g/dL (ref 30.0–36.0)
MCV: 84.6 fL (ref 78.0–100.0)
Monocytes Absolute: 0.5 10*3/uL (ref 0.1–1.0)
Monocytes Relative: 10 % (ref 3–12)
Neutro Abs: 2.2 10*3/uL (ref 1.7–7.7)
Neutrophils Relative %: 46 % (ref 43–77)
Platelets: 239 10*3/uL (ref 150–400)
RBC: 4.21 MIL/uL (ref 3.87–5.11)
RDW: 15.3 % (ref 11.5–15.5)
WBC: 4.7 10*3/uL (ref 4.0–10.5)

## 2013-12-12 LAB — COMPLETE METABOLIC PANEL WITH GFR
ALT: 10 U/L (ref 0–35)
AST: 18 U/L (ref 0–37)
Albumin: 4.4 g/dL (ref 3.5–5.2)
Alkaline Phosphatase: 81 U/L (ref 39–117)
BUN: 26 mg/dL — ABNORMAL HIGH (ref 6–23)
CO2: 28 mEq/L (ref 19–32)
Calcium: 10.5 mg/dL (ref 8.4–10.5)
Chloride: 102 mEq/L (ref 96–112)
Creat: 1.06 mg/dL (ref 0.50–1.10)
GFR, Est African American: 61 mL/min
GFR, Est Non African American: 53 mL/min — ABNORMAL LOW
Glucose, Bld: 92 mg/dL (ref 70–99)
Potassium: 4.5 mEq/L (ref 3.5–5.3)
Sodium: 141 mEq/L (ref 135–145)
Total Bilirubin: 0.6 mg/dL (ref 0.2–1.2)
Total Protein: 7.3 g/dL (ref 6.0–8.3)

## 2013-12-12 LAB — LIPID PANEL
Cholesterol: 169 mg/dL (ref 0–200)
HDL: 43 mg/dL (ref >39)
LDL Cholesterol: 100 mg/dL — ABNORMAL HIGH (ref 0–99)
Total CHOL/HDL Ratio: 3.9 Ratio
Triglycerides: 130 mg/dL (ref <150)
VLDL: 26 mg/dL (ref 0–40)

## 2013-12-12 LAB — VITAMIN D 25 HYDROXY (VIT D DEFICIENCY, FRACTURES): Vit D, 25-Hydroxy: 41 ng/mL (ref 30–89)

## 2013-12-12 LAB — TSH: TSH: 1.459 u[IU]/mL (ref 0.350–4.500)

## 2013-12-18 ENCOUNTER — Encounter: Payer: Self-pay | Admitting: Family Medicine

## 2013-12-18 ENCOUNTER — Ambulatory Visit (INDEPENDENT_AMBULATORY_CARE_PROVIDER_SITE_OTHER): Payer: Medicare Other | Admitting: Family Medicine

## 2013-12-18 VITALS — BP 129/74 | HR 50 | Resp 16 | Ht 62.0 in | Wt 140.0 lb

## 2013-12-18 DIAGNOSIS — R413 Other amnesia: Secondary | ICD-10-CM

## 2013-12-18 DIAGNOSIS — Z0000 Encounter for general adult medical examination without abnormal findings: Secondary | ICD-10-CM

## 2013-12-18 DIAGNOSIS — L299 Pruritus, unspecified: Secondary | ICD-10-CM

## 2013-12-18 DIAGNOSIS — Z23 Encounter for immunization: Secondary | ICD-10-CM

## 2013-12-18 LAB — POCT URINALYSIS DIPSTICK
Bilirubin, UA: NEGATIVE
Blood, UA: NEGATIVE
Glucose, UA: NEGATIVE
Ketones, UA: NEGATIVE
Leukocytes, UA: NEGATIVE
Nitrite, UA: NEGATIVE
Protein, UA: NEGATIVE
Spec Grav, UA: 1.015
Urobilinogen, UA: NEGATIVE
pH, UA: 6.5

## 2013-12-18 NOTE — Progress Notes (Signed)
Subjective:    Patient ID: Margaret Moore, female    DOB: 1941-06-02, 73 y.o.   MRN: [id number]  HPI  Margaret Moore is here today for her annual CPE. She had a normal PAP in 2012. Her only complaint today is some gas and bloating.  She has been taking Nexium for this problem which has not helped very much.  We are going over her recent lab results.    Review of Systems  Constitutional: Negative for activity change, appetite change, fatigue and unexpected weight change.  Cardiovascular: Negative for chest pain, palpitations and leg swelling.  Gastrointestinal:       Increased Bloating and gas  Psychiatric/Behavioral: Negative for behavioral problems and sleep disturbance. The patient is not nervous/anxious.   All other systems reviewed and are negative.    Past Medical History  Diagnosis Date  . Cardiovascular disease 1997    Quadruple Bypass Surgery 1997  . Hyperlipidemia   . Hypertension   . 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    . IFG (impaired fasting glucose)   . Tinnitus   . Colon polyps   . Osteopenia   . Hyperparathyroidism 03/2012    Left Superior Adenoma      Past Surgical History  Procedure Laterality Date  . Coronary artery bypass graft  1997    Quadruple   . Breast surgery  2013    She went in for breast reduction and the pathology report showed breast cancer.    . Cholecystectomy  1990     History   Social History Narrative   Marital Status: Married Dance movement psychotherapist)   Children:  Daughter (Deceased) Step-Children (Sons - 3; Daughter -1) Charlott Holler (She lives in [place])   Pets: Cats   Living Situation: Lives with  spouse   Occupation: Retired Land (Disabled)   Education: 9 th Grade    Tobacco Use/Exposure:  She smoked 1/2 ppd for > 20 years.  She quit smoking 20 years ago.     Alcohol Use:  None   Drug Use:  None   Diet:  Regular Low Sodium   Exercise:  Walking   Hobbies: Gardening, Flowers                       Family History  Problem Relation Age of Onset  . Stroke Mother   . Diabetes Mother   . Heart disease Sister   . Diabetes Sister   . Cancer Sister   . Colon cancer Sister   . Heart disease Brother   . Hypertension Brother   . Alcohol abuse Father   . Asthma Sister      Current Outpatient Prescriptions on File Prior to Visit  Medication Sig Dispense Refill  . aspirin (ASPIRIN EC) 81 MG EC tablet Take 81 mg by mouth daily. Swallow whole.      Marland Kitchen atenolol-chlorthalidone (TENORETIC) 50-25 MG per tablet       . NEXIUM 40 MG capsule       . NITROSTAT 0.4 MG SL tablet        No current facility-administered medications on file prior to visit.     Allergies  Allergen Reactions  . Ace Inhibitors Cough  . Codeine Itching     Immunization History  Administered Date(s) Administered  . Influenza,inj,Quad PF,36+ Mos 04/21/2013  . Pneumococcal Conjugate-13 12/18/2013  . Pneumococcal-Unspecified 04/20/2008  .  Tdap 10/08/2011  . Zoster 03/29/2008       Objective:   Physical Exam  Nursing note and vitals reviewed. Constitutional: She is oriented to person, place, and time. She appears well-developed and well-nourished.  HENT:  Head: Normocephalic and atraumatic.  Right Ear: External ear normal.  Left Ear: External ear normal.  Nose: Nose normal.  Mouth/Throat: Oropharynx is clear and moist.  Eyes: Conjunctivae and EOM are normal. Pupils are equal, round, and reactive to light.  Neck: Normal range of motion. No thyromegaly present.  Cardiovascular: Normal rate, regular rhythm, normal heart sounds and intact distal pulses.  Exam reveals no gallop and no friction rub.   No murmur heard. Pulmonary/Chest: Effort normal and breath sounds normal. Right breast exhibits no inverted nipple, no mass, no nipple discharge, no skin change and no tenderness. Left breast exhibits no inverted nipple, no mass, no nipple discharge, no skin  change and no tenderness. Breasts are symmetrical.  Abdominal: Soft. Bowel sounds are normal. There is no tenderness. Hernia confirmed negative in the right inguinal area and confirmed negative in the left inguinal area.  Genitourinary: Vagina normal and uterus normal. Pelvic exam was performed with patient supine. There is no rash, tenderness or lesion on the right labia. There is no rash, tenderness or lesion on the left labia. No vaginal discharge found.  Musculoskeletal: Normal range of motion. She exhibits no edema and no tenderness.  Lymphadenopathy:    She has no cervical adenopathy.       Right: No inguinal adenopathy present.       Left: No inguinal adenopathy present.  Neurological: She is alert and oriented to person, place, and time. She has normal reflexes.  Skin: Skin is warm and dry.  Psychiatric: She has a normal mood and affect. Her behavior is normal. Judgment and thought content normal.      Assessment & Plan:    Margaret Moore was seen today for annual exam.  Diagnoses and associated orders for this visit:  Routine general medical examination at a health care facility - POCT urinalysis dipstick  Need for prophylactic vaccination against Streptococcus pneumoniae (pneumococcus) - Pneumococcal conjugate vaccine 13-valent  Itching - hydrOXYzine (VISTARIL) 25 MG capsule; Take 1 capsule (25 mg total) by mouth every 8 (eight) hours as needed for itching.  Memory loss - Memantine HCl ER (NAMENDA XR) 21 MG CP24; Take 1 capsule by mouth daily.   TIME SPENT "FACE TO FACE" WITH PATIENT -  14 MINS

## 2013-12-25 MED ORDER — HYDROXYZINE PAMOATE 25 MG PO CAPS: 25.0000 mg | ORAL_CAPSULE | Freq: Three times a day (TID) | ORAL | Status: AC | PRN

## 2013-12-25 MED ORDER — MEMANTINE HCL ER 21 MG PO CP24: 1.0000 | ORAL_CAPSULE | Freq: Every day | ORAL | Status: AC

## 2014-01-18 ENCOUNTER — Other Ambulatory Visit: Payer: Self-pay | Admitting: *Deleted

## 2014-01-18 LAB — HM MAMMOGRAPHY: HM Mammogram: NORMAL

## 2014-01-18 LAB — HM DEXA SCAN: HM DEXA SCAN: NORMAL

## 2014-01-19 ENCOUNTER — Encounter: Payer: Self-pay | Admitting: *Deleted

## 2014-01-19 ENCOUNTER — Other Ambulatory Visit: Payer: Self-pay | Admitting: Family Medicine

## 2014-02-04 ENCOUNTER — Ambulatory Visit (INDEPENDENT_AMBULATORY_CARE_PROVIDER_SITE_OTHER): Payer: Medicare Other | Admitting: Family Medicine

## 2014-02-04 ENCOUNTER — Encounter: Payer: Self-pay | Admitting: Family Medicine

## 2014-02-04 VITALS — BP 142/76 | HR 66 | Resp 16 | Wt 142.0 lb

## 2014-02-04 DIAGNOSIS — R21 Rash and other nonspecific skin eruption: Secondary | ICD-10-CM

## 2014-02-04 DIAGNOSIS — R059 Cough, unspecified: Secondary | ICD-10-CM

## 2014-02-04 DIAGNOSIS — R05 Cough: Secondary | ICD-10-CM

## 2014-02-04 MED ORDER — CLOBETASOL PROPIONATE 0.05 % EX CREA: 1.0000 "application " | TOPICAL_CREAM | Freq: Two times a day (BID) | CUTANEOUS | Status: AC

## 2014-02-04 MED ORDER — PROMETHAZINE-DM 6.25-15 MG/5ML PO SYRP: ORAL_SOLUTION | ORAL | Status: AC

## 2014-02-04 NOTE — Progress Notes (Signed)
Subjective:    Patient ID: Margaret Moore, female    DOB: 05-Jan-1941, 73 y.o.   MRN: [id number]  HPI  Margaret Moore is here today complaining of a cough. She has had this cough for about 2 weeks. She has tried Robitussin DM which has not helped very much.  She would like to get a refill on the promethazine DM syrup. She also is complaining of a itchy rash on her inner thighs. She noticed it about 3 weeks ago.    Review of Systems  Constitutional: Negative for activity change, appetite change and unexpected weight change.  HENT: Positive for congestion.   Respiratory: Positive for cough.   Skin: Positive for rash.       Rash on her inner thighs  All other systems reviewed and are negative.    Past Medical History  Diagnosis Date  . Cardiovascular disease 1997    Quadruple Bypass Surgery 1997  . Hyperlipidemia   . Hypertension   . 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    . IFG (impaired fasting glucose)   . Tinnitus   . Colon polyps   . Osteopenia   . Hyperparathyroidism 03/2012    Left Superior Adenoma      Past Surgical History  Procedure Laterality Date  . Coronary artery bypass graft  1997    Quadruple   . Breast surgery  2013    She went in for breast reduction and the pathology report showed breast cancer.    . Cholecystectomy  1990     History   Social History Narrative   Marital Status: Married Dance movement psychotherapist)   Children:  Daughter (Deceased) Step-Children (Sons - 3; Daughter -1) Charlott Holler (She lives in [place])   Pets: Cats   Living Situation: Lives with  spouse   Occupation: Retired Land (Disabled)   Education: 9 th Grade    Tobacco Use/Exposure:  She smoked 1/2 ppd for > 20 years.  She quit smoking 20 years ago.     Alcohol Use:  None   Drug Use:  None   Diet:  Regular Low Sodium   Exercise:  Walking   Hobbies: Gardening, Flowers                       Family  History  Problem Relation Age of Onset  . Stroke Mother   . Diabetes Mother   . Heart disease Sister   . Diabetes Sister   . Cancer Sister   . Colon cancer Sister   . Heart disease Brother   . Hypertension Brother   . Alcohol abuse Father   . Asthma Sister      Current Outpatient Prescriptions on File Prior to Visit  Medication Sig Dispense Refill  . aspirin (ASPIRIN EC) 81 MG EC tablet Take 81 mg by mouth daily. Swallow whole.      Marland Kitchen atenolol-chlorthalidone (TENORETIC) 50-25 MG per tablet       . hydrOXYzine (VISTARIL) 25 MG capsule Take 1 capsule (25 mg total) by mouth every 8 (eight) hours as needed for itching.  60 capsule  3  . Memantine HCl ER (NAMENDA XR) 21 MG CP24 Take 1 capsule by mouth daily.  30 capsule  11  . NEXIUM 40 MG capsule       . NITROSTAT 0.4 MG SL tablet        No  current facility-administered medications on file prior to visit.     Allergies  Allergen Reactions  . Ace Inhibitors Cough  . Codeine Itching     Immunization History  Administered Date(s) Administered  . Influenza,inj,Quad PF,36+ Mos 04/21/2013  . Pneumococcal Conjugate-13 12/18/2013  . Pneumococcal-Unspecified 04/20/2008  . Tdap 10/08/2011  . Zoster 03/29/2008       Objective:   Physical Exam  Vitals reviewed. Constitutional: She appears well-nourished. No distress.  HENT:  Head: Normocephalic.  Mouth/Throat: No oropharyngeal exudate.  Eyes: Conjunctivae are normal. Right eye exhibits no discharge. Left eye exhibits no discharge.  Neck: Neck supple.  Cardiovascular: Normal rate, regular rhythm and normal heart sounds.  Exam reveals no gallop and no friction rub.   No murmur heard. Pulmonary/Chest: Effort normal and breath sounds normal. She has no wheezes. She exhibits no tenderness.  Lymphadenopathy:    She has no cervical adenopathy.  Neurological: She is alert.  Skin: Skin is warm and dry. No rash noted.  Psychiatric: She has a normal mood and affect. Her behavior is  normal. Judgment and thought content normal.      Assessment & Plan:    Margaret Moore was seen today for cough.  Diagnoses and associated orders for this visit:  Cough - promethazine-dextromethorphan (PROMETHAZINE-DM) 6.25-15 MG/5ML syrup; Take 1 teaspoon up to 4 times per day for nausea/cough  Rash and nonspecific skin eruption - clobetasol cream (TEMOVATE) 0.05 %; Apply 1 application topically 2 (two) times daily.

## 2014-02-08 ENCOUNTER — Telehealth: Payer: Self-pay

## 2014-02-08 NOTE — Telephone Encounter (Signed)
Rily called and said that the cream is causing burning. Can we change it?

## 2014-02-09 ENCOUNTER — Other Ambulatory Visit: Payer: Self-pay | Admitting: *Deleted

## 2014-02-09 ENCOUNTER — Other Ambulatory Visit: Payer: Self-pay | Admitting: Family Medicine

## 2014-02-09 DIAGNOSIS — R413 Other amnesia: Secondary | ICD-10-CM

## 2014-02-09 DIAGNOSIS — L299 Pruritus, unspecified: Secondary | ICD-10-CM

## 2014-02-10 ENCOUNTER — Telehealth: Payer: Self-pay

## 2014-02-10 NOTE — Telephone Encounter (Signed)
Kyira called and said leave her prescriptions just like they were, she would continue to make it work.

## 2014-02-23 ENCOUNTER — Telehealth: Payer: Self-pay

## 2014-02-23 NOTE — Telephone Encounter (Signed)
Cough medicine not working, she is still coughing. What should she do?

## 2014-03-22 ENCOUNTER — Encounter: Payer: Self-pay | Admitting: Family Medicine

## 2014-04-17 ENCOUNTER — Emergency Department (HOSPITAL_BASED_OUTPATIENT_CLINIC_OR_DEPARTMENT_OTHER): Payer: Medicare Other

## 2014-04-17 ENCOUNTER — Emergency Department (HOSPITAL_BASED_OUTPATIENT_CLINIC_OR_DEPARTMENT_OTHER)
Admission: EM | Admit: 2014-04-17 | Discharge: 2014-04-18 | Disposition: A | Discharge status: Home / Self Care | Payer: Medicare Other | Attending: Emergency Medicine | Admitting: Emergency Medicine

## 2014-04-17 ENCOUNTER — Encounter (HOSPITAL_BASED_OUTPATIENT_CLINIC_OR_DEPARTMENT_OTHER): Payer: Self-pay | Admitting: Emergency Medicine

## 2014-04-17 DIAGNOSIS — Z7982 Long term (current) use of aspirin: Secondary | ICD-10-CM | POA: Diagnosis not present

## 2014-04-17 DIAGNOSIS — R42 Dizziness and giddiness: Secondary | ICD-10-CM | POA: Diagnosis present

## 2014-04-17 DIAGNOSIS — Z8673 Personal history of transient ischemic attack (TIA), and cerebral infarction without residual deficits: Secondary | ICD-10-CM | POA: Insufficient documentation

## 2014-04-17 DIAGNOSIS — R5381 Other malaise: Secondary | ICD-10-CM | POA: Insufficient documentation

## 2014-04-17 DIAGNOSIS — I10 Essential (primary) hypertension: Secondary | ICD-10-CM | POA: Diagnosis not present

## 2014-04-17 DIAGNOSIS — R5383 Other fatigue: Secondary | ICD-10-CM | POA: Diagnosis not present

## 2014-04-17 DIAGNOSIS — Z860100 Personal history of colon polyps, unspecified: Secondary | ICD-10-CM | POA: Insufficient documentation

## 2014-04-17 DIAGNOSIS — Z8639 Personal history of other endocrine, nutritional and metabolic disease: Secondary | ICD-10-CM | POA: Insufficient documentation

## 2014-04-17 DIAGNOSIS — Z853 Personal history of malignant neoplasm of breast: Secondary | ICD-10-CM | POA: Diagnosis not present

## 2014-04-17 DIAGNOSIS — R269 Unspecified abnormalities of gait and mobility: Secondary | ICD-10-CM | POA: Diagnosis not present

## 2014-04-17 DIAGNOSIS — Z8669 Personal history of other diseases of the nervous system and sense organs: Secondary | ICD-10-CM | POA: Diagnosis not present

## 2014-04-17 DIAGNOSIS — Z862 Personal history of diseases of the blood and blood-forming organs and certain disorders involving the immune mechanism: Secondary | ICD-10-CM | POA: Insufficient documentation

## 2014-04-17 DIAGNOSIS — Z8739 Personal history of other diseases of the musculoskeletal system and connective tissue: Secondary | ICD-10-CM | POA: Insufficient documentation

## 2014-04-17 DIAGNOSIS — Z79899 Other long term (current) drug therapy: Secondary | ICD-10-CM | POA: Insufficient documentation

## 2014-04-17 DIAGNOSIS — Z87891 Personal history of nicotine dependence: Secondary | ICD-10-CM | POA: Diagnosis not present

## 2014-04-17 LAB — URINALYSIS, ROUTINE W REFLEX MICROSCOPIC
Bilirubin Urine: NEGATIVE
Glucose, UA: NEGATIVE mg/dL
HGB URINE DIPSTICK: NEGATIVE
Ketones, ur: NEGATIVE mg/dL
Leukocytes, UA: NEGATIVE
NITRITE: NEGATIVE
Protein, ur: NEGATIVE mg/dL
SPECIFIC GRAVITY, URINE: 1.017 (ref 1.005–1.030)
Urobilinogen, UA: 1 mg/dL (ref 0.0–1.0)
pH: 7 (ref 5.0–8.0)

## 2014-04-17 LAB — CBC WITH DIFFERENTIAL/PLATELET
BASOS PCT: 1 % (ref 0–1)
Basophils Absolute: 0 10*3/uL (ref 0.0–0.1)
Eosinophils Absolute: 0.1 10*3/uL (ref 0.0–0.7)
Eosinophils Relative: 2 % (ref 0–5)
HEMATOCRIT: 39 % (ref 36.0–46.0)
HEMOGLOBIN: 13 g/dL (ref 12.0–15.0)
Lymphocytes Relative: 55 % — ABNORMAL HIGH (ref 12–46)
Lymphs Abs: 2.5 10*3/uL (ref 0.7–4.0)
MCH: 29.4 pg (ref 26.0–34.0)
MCHC: 33.3 g/dL (ref 30.0–36.0)
MCV: 88.2 fL (ref 78.0–100.0)
Monocytes Absolute: 0.5 10*3/uL (ref 0.1–1.0)
Monocytes Relative: 11 % (ref 3–12)
Neutro Abs: 1.4 10*3/uL — ABNORMAL LOW (ref 1.7–7.7)
Neutrophils Relative %: 31 % — ABNORMAL LOW (ref 43–77)
Platelets: 201 10*3/uL (ref 150–400)
RBC: 4.42 MIL/uL (ref 3.87–5.11)
RDW: 13.7 % (ref 11.5–15.5)
WBC: 4.4 10*3/uL (ref 4.0–10.5)

## 2014-04-17 LAB — BASIC METABOLIC PANEL
Anion gap: 13 (ref 5–15)
BUN: 27 mg/dL — ABNORMAL HIGH (ref 6–23)
CO2: 25 mEq/L (ref 19–32)
Calcium: 10.6 mg/dL — ABNORMAL HIGH (ref 8.4–10.5)
Chloride: 103 mEq/L (ref 96–112)
Creatinine, Ser: 1.1 mg/dL (ref 0.50–1.10)
GFR calc Af Amer: 57 mL/min — ABNORMAL LOW (ref >90)
GFR calc non Af Amer: 49 mL/min — ABNORMAL LOW (ref >90)
Glucose, Bld: 103 mg/dL — ABNORMAL HIGH (ref 70–99)
Potassium: 4.6 mEq/L (ref 3.7–5.3)
Sodium: 141 mEq/L (ref 137–147)

## 2014-04-17 MED ORDER — SODIUM CHLORIDE 0.9 % IV BOLUS (SEPSIS)
500.0000 mL | Freq: Once | INTRAVENOUS | Status: AC
  Administered 2014-04-17: 500 mL via INTRAVENOUS

## 2014-04-17 MED ORDER — MORPHINE SULFATE 4 MG/ML IJ SOLN
4.0000 mg | Freq: Once | INTRAMUSCULAR | Status: AC
  Administered 2014-04-17: 4 mg via INTRAVENOUS
  Filled 2014-04-17: qty 1

## 2014-04-17 NOTE — Discharge Instructions (Signed)
Dizziness °Dizziness is a common problem. It is a feeling of unsteadiness or light-headedness. You may feel like you are about to faint. Dizziness can lead to injury if you stumble or fall. A person of any age group can suffer from dizziness, but dizziness is more common in older adults. °CAUSES  °Dizziness can be caused by many different things, including: °· Middle ear problems. °· Standing for too long. °· Infections. °· An allergic reaction. °· Aging. °· An emotional response to something, such as the sight of blood. °· Side effects of medicines. °· Tiredness. °· Problems with circulation or blood pressure. °· Excessive use of alcohol or medicines, or illegal drug use. °· Breathing too fast (hyperventilation). °· An irregular heart rhythm (arrhythmia). °· A low red blood cell count (anemia). °· Pregnancy. °· Vomiting, diarrhea, fever, or other illnesses that cause body fluid loss (dehydration). °· Diseases or conditions such as Parkinson's disease, high blood pressure (hypertension), diabetes, and thyroid problems. °· Exposure to extreme heat. °DIAGNOSIS  °Your health care provider will ask about your symptoms, perform a physical exam, and perform an electrocardiogram (ECG) to record the electrical activity of your heart. Your health care provider may also perform other heart or blood tests to determine the cause of your dizziness. These may include: °· Transthoracic echocardiogram (TTE). During echocardiography, sound waves are used to evaluate how blood flows through your heart. °· Transesophageal echocardiogram (TEE). °· Cardiac monitoring. This allows your health care provider to monitor your heart rate and rhythm in real time. °· Holter monitor. This is a portable device that records your heartbeat and can help diagnose heart arrhythmias. It allows your health care provider to track your heart activity for several days if needed. °· Stress tests by exercise or by giving medicine that makes the heart beat  faster. °TREATMENT  °Treatment of dizziness depends on the cause of your symptoms and can vary greatly. °HOME CARE INSTRUCTIONS  °· Drink enough fluids to keep your urine clear or pale yellow. This is especially important in very hot weather. In older adults, it is also important in cold weather. °· Take your medicine exactly as directed if your dizziness is caused by medicines. When taking blood pressure medicines, it is especially important to get up slowly. °¨ Rise slowly from chairs and steady yourself until you feel okay. °¨ In the morning, first sit up on the side of the bed. When you feel okay, stand slowly while holding onto something until you know your balance is fine. °· Move your legs often if you need to stand in one place for a long time. Tighten and relax your muscles in your legs while standing. °· Have someone stay with you for 1-2 days if dizziness continues to be a problem. Do this until you feel you are well enough to stay alone. Have the person call your health care provider if he or she notices changes in you that are concerning. °· Do not drive or use heavy machinery if you feel dizzy. °· Do not drink alcohol. °SEEK IMMEDIATE MEDICAL CARE IF:  °· Your dizziness or light-headedness gets worse. °· You feel nauseous or vomit. °· You have problems talking, walking, or using your arms, hands, or legs. °· You feel weak. °· You are not thinking clearly or you have trouble forming sentences. It may take a friend or family member to notice this. °· You have chest pain, abdominal pain, shortness of breath, or sweating. °· Your vision changes. °· You notice   any bleeding. °· You have side effects from medicine that seems to be getting worse rather than better. °MAKE SURE YOU:  °· Understand these instructions. °· Will watch your condition. °· Will get help right away if you are not doing well or get worse. °Document Released: 01/09/2001 Document Revised: 07/21/2013 Document Reviewed: 02/02/2011 °[employer]®  Patient Information ©2015 [employer]. This information is not intended to replace advice given to you by your health care provider. Make sure you discuss any questions you have with your health care provider. °Fatigue °Fatigue is a feeling of tiredness, lack of energy, lack of motivation, or feeling tired all the time. Having enough rest, good nutrition, and reducing stress will normally reduce fatigue. Consult your caregiver if it persists. The nature of your fatigue will help your caregiver to find out its cause. The treatment is based on the cause.  °CAUSES  °There are many causes for fatigue. Most of the time, fatigue can be traced to one or more of your habits or routines. Most causes fit into one or more of three general areas. They are: °Lifestyle problems °· Sleep disturbances. °· Overwork. °· Physical exertion. °· Unhealthy habits. °¨ Poor eating habits or eating disorders. °¨ Alcohol and/or drug use . °¨ Lack of proper nutrition (malnutrition). °Psychological problems °· Stress and/or anxiety problems. °· Depression. °· Grief. °· Boredom. °Medical Problems or Conditions °· Anemia. °· Pregnancy. °· Thyroid gland problems. °· Recovery from major surgery. °· Continuous pain. °· Emphysema or asthma that is not well controlled °· Allergic conditions. °· Diabetes. °· Infections (such as mononucleosis). °· Obesity. °· Sleep disorders, such as sleep apnea. °· Heart failure or other heart-related problems. °· Cancer. °· Kidney disease. °· Liver disease. °· Effects of certain medicines such as antihistamines, cough and cold remedies, prescription pain medicines, heart and blood pressure medicines, drugs used for treatment of cancer, and some antidepressants. °SYMPTOMS  °The symptoms of fatigue include:  °· Lack of energy. °· Lack of drive (motivation). °· Drowsiness. °· Feeling of indifference to the surroundings. °DIAGNOSIS  °The details of how you feel help guide your caregiver in finding out what is causing  the fatigue. You will be asked about your present and past health condition. It is important to review all medicines that you take, including prescription and non-prescription items. A thorough exam will be done. You will be questioned about your feelings, habits, and normal lifestyle. Your caregiver may suggest blood tests, urine tests, or other tests to look for common medical causes of fatigue.  °TREATMENT  °Fatigue is treated by correcting the underlying cause. For example, if you have continuous pain or depression, treating these causes will improve how you feel. Similarly, adjusting the dose of certain medicines will help in reducing fatigue.  °HOME CARE INSTRUCTIONS  °· Try to get the required amount of good sleep every night. °· Eat a healthy and nutritious diet, and drink enough water throughout the day. °· Practice ways of relaxing (including yoga or meditation). °· Exercise regularly. °· Make plans to change situations that cause stress. Act on those plans so that stresses decrease over time. Keep your work and personal routine reasonable. °· Avoid street drugs and minimize use of alcohol. °· Start taking a daily multivitamin after consulting your caregiver. °SEEK MEDICAL CARE IF:  °· You have persistent tiredness, which cannot be accounted for. °· You have fever. °· You have unintentional weight loss. °· You have headaches. °· You have disturbed sleep throughout the night. °·   You are feeling sad. °· You have constipation. °· You have dry skin. °· You have gained weight. °· You are taking any new or different medicines that you suspect are causing fatigue. °· You are unable to sleep at night. °· You develop any unusual swelling of your legs or other parts of your body. °SEEK IMMEDIATE MEDICAL CARE IF:  °· You are feeling confused. °· Your vision is blurred. °· You feel faint or pass out. °· You develop severe headache. °· You develop severe abdominal, pelvic, or back pain. °· You develop chest pain,  shortness of breath, or an irregular or fast heartbeat. °· You are unable to pass a normal amount of urine. °· You develop abnormal bleeding such as bleeding from the rectum or you vomit blood. °· You have thoughts about harming yourself or committing suicide. °· You are worried that you might harm someone else. °MAKE SURE YOU:  °· Understand these instructions. °· Will watch your condition. °· Will get help right away if you are not doing well or get worse. °Document Released: 05/13/2007 Document Revised: 10/08/2011 Document Reviewed: 11/17/2013 °[employer]® Patient Information ©2015 [employer]. This information is not intended to replace advice given to you by your health care provider. Make sure you discuss any questions you have with your health care provider. ° °

## 2014-04-17 NOTE — ED Provider Notes (Signed)
CSN: [id number]     Arrival date & time 04/17/14  1715 History  This chart was scribed for Ernestina Patches, MD by Martinique Peace, ED Scribe. The patient was seen in [facility]. The patient's care was started at 6:29 PM.    Chief Complaint  Patient presents with  . Dizziness      Patient is a 73 y.o. female presenting with dizziness. The history is provided by the patient. No language interpreter was used.  Dizziness Quality:  Imbalance Context: standing up   Context: not with head movement, not with loss of consciousness and not when urinating   Relieved by:  Being still Worsened by:  Standing up and eye movement Ineffective treatments:  Change in position Associated symptoms: no chest pain, no diarrhea, no headaches, no nausea, no palpitations, no shortness of breath and no vomiting   Risk factors: no hx of stroke    HPI Comments: Margaret Moore is a 73 y.o. female who presents to the Emergency Department complaining of intermittent dizziness and weakness. Pt states that she just isn't feeling well. Pt states that she was also experiencing this about a week ago. Pt states that once she stands up, she begins to feel like she is going to lose her balance but after a couple seconds she has to get herself together. She denies notion that head movement exacerbates symptoms. Pt further denies fever, chills, cough, or chest pain. She reports no history of strokes or urinary problems. PCP is Dr. Dion Saucier. Pt further states that it has been a few months since she has seen her cardiology provider. Pt is former smoker. 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 Pt has history of hypertension and hyperlipidemia.   Past Medical History  Diagnosis Date  . Cardiovascular disease 1997    Quadruple Bypass Surgery 1997  . Hyperlipidemia   . Hypertension   . 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     . IFG (impaired fasting glucose)   . Tinnitus   . Colon polyps   . Osteopenia   . Hyperparathyroidism 03/2012    Left Superior Adenoma    Past Surgical History  Procedure Laterality Date  . Coronary artery bypass graft  1997    Quadruple   . Breast surgery  2013    She went in for breast reduction and the pathology report showed breast cancer.    . Cholecystectomy  1990   Family History  Problem Relation Age of Onset  . Stroke Mother   . Diabetes Mother   . Heart disease Sister   . Diabetes Sister   . Cancer Sister   . Colon cancer Sister   . Heart disease Brother   . Hypertension Brother   . Alcohol abuse Father   . Asthma Sister    History  Substance Use Topics  . Smoking status: Former Smoker -- 0.50 packs/day for 20 years    Types: Cigarettes  . Smokeless tobacco: Never Used  . Alcohol Use: No   OB History   Grav Para Term Preterm Abortions TAB SAB Ect Mult Living                 Review of Systems  Constitutional: Negative for fever, chills, diaphoresis, activity change, appetite change and fatigue.  HENT: Negative for congestion, facial swelling, rhinorrhea and sore throat.   Eyes: Negative for photophobia  and discharge.  Respiratory: Negative for cough, chest tightness and shortness of breath.   Cardiovascular: Negative for chest pain, palpitations and leg swelling.  Gastrointestinal: Negative for nausea, vomiting, abdominal pain and diarrhea.  Endocrine: Negative for polydipsia and polyuria.  Genitourinary: Negative for dysuria, frequency, difficulty urinating and pelvic pain.  Musculoskeletal: Negative for arthralgias, back pain, neck pain and neck stiffness.  Skin: Negative for color change and wound.  Allergic/Immunologic: Negative for immunocompromised state.  Neurological: Positive for dizziness and weakness. Negative for facial asymmetry, numbness and headaches.  Hematological: Does not bruise/bleed easily.  Psychiatric/Behavioral: Negative for  confusion and agitation.      Allergies  Ace inhibitors and Codeine  Home Medications   Prior to Admission medications   Medication Sig Start Date End Date Taking? Authorizing Provider  aspirin (ASPIRIN EC) 81 MG EC tablet Take 81 mg by mouth daily. Swallow whole.    Historical Provider, MD  atenolol-chlorthalidone (TENORETIC) 50-25 MG per tablet  10/09/12   Historical Provider, MD  clobetasol cream (TEMOVATE) 0.09 % Apply 1 application topically 2 (two) times daily. 02/04/14 02/05/15  Jonathon Resides, MD  hydrOXYzine (VISTARIL) 25 MG capsule Take 1 capsule (25 mg total) by mouth every 8 (eight) hours as needed for itching. 12/25/13 12/26/14  Jonathon Resides, MD  Memantine HCl ER (NAMENDA XR) 21 MG CP24 Take 1 capsule by mouth daily. 12/25/13 12/25/14  Jonathon Resides, MD  NEXIUM 40 MG capsule  11/26/12   Historical Provider, MD  NITROSTAT 0.4 MG SL tablet  08/05/13   Historical Provider, MD  promethazine-dextromethorphan (PROMETHAZINE-DM) 6.25-15 MG/5ML syrup Take 1 teaspoon up to 4 times per day for nausea/cough 02/04/14 02/05/15  Jonathon Resides, MD   BP 144/77  Pulse 69  Temp(Src) 98.2 F (36.8 C) (Oral)  Resp 20  SpO2 96% Physical Exam  Nursing note and vitals reviewed. Constitutional: She is oriented to person, place, and time. She appears well-developed and well-nourished. No distress.  HENT:  Head: Normocephalic.  Mouth/Throat: Oropharynx is clear and moist.  Eyes: Pupils are equal, round, and reactive to light.  Neck: Neck supple.  Cardiovascular: Normal rate, regular rhythm and normal heart sounds.   Pulmonary/Chest: Effort normal and breath sounds normal. No respiratory distress. She has no wheezes.  Abdominal: Soft. She exhibits no distension. There is no tenderness. There is no rebound and no guarding.  Musculoskeletal: She exhibits no edema and no tenderness.  Neurological: She is alert and oriented to person, place, and time. She displays no atrophy and no tremor. No cranial nerve  deficit or sensory deficit. She exhibits normal muscle tone. She displays no seizure activity. Gait (sensation of feeling off balance while ambulating. ) abnormal. Coordination normal. GCS eye subscore is 4. GCS verbal subscore is 5. GCS motor subscore is 6.  No deviation with test of skew.   Skin: Skin is warm and dry.  Psychiatric: She has a normal mood and affect.    ED Course  Procedures (including critical care time) Labs Review Labs Reviewed  CBC WITH DIFFERENTIAL - Abnormal; Notable for the following:    Neutrophils Relative % 31 (*)    Neutro Abs 1.4 (*)    Lymphocytes Relative 55 (*)    All other components within normal limits  BASIC METABOLIC PANEL - Abnormal; Notable for the following:    Glucose, Bld 103 (*)    BUN 27 (*)    Calcium 10.6 (*)    GFR calc non Af Amer 49 (*)  GFR calc Af Amer 57 (*)    All other components within normal limits  URINE CULTURE  URINALYSIS, ROUTINE W REFLEX MICROSCOPIC   Results for orders placed during the hospital encounter of 04/17/14  URINALYSIS, ROUTINE W REFLEX MICROSCOPIC      Result Value Ref Range   Color, Urine YELLOW  YELLOW   APPearance CLEAR  CLEAR   Specific Gravity, Urine 1.017  1.005 - 1.030   pH 7.0  5.0 - 8.0   Glucose, UA NEGATIVE  NEGATIVE mg/dL   Hgb urine dipstick NEGATIVE  NEGATIVE   Bilirubin Urine NEGATIVE  NEGATIVE   Ketones, ur NEGATIVE  NEGATIVE mg/dL   Protein, ur NEGATIVE  NEGATIVE mg/dL   Urobilinogen, UA 1.0  0.0 - 1.0 mg/dL   Nitrite NEGATIVE  NEGATIVE   Leukocytes, UA NEGATIVE  NEGATIVE  CBC WITH DIFFERENTIAL      Result Value Ref Range   WBC 4.4  4.0 - 10.5 K/uL   RBC 4.42  3.87 - 5.11 MIL/uL   Hemoglobin 13.0  12.0 - 15.0 g/dL   HCT 39.0  36.0 - 46.0 %   MCV 88.2  78.0 - 100.0 fL   MCH 29.4  26.0 - 34.0 pg   MCHC 33.3  30.0 - 36.0 g/dL   RDW 13.7  11.5 - 15.5 %   Platelets 201  150 - 400 K/uL   Neutrophils Relative % 31 (*) 43 - 77 %   Neutro Abs 1.4 (*) 1.7 - 7.7 K/uL   Lymphocytes  Relative 55 (*) 12 - 46 %   Lymphs Abs 2.5  0.7 - 4.0 K/uL   Monocytes Relative 11  3 - 12 %   Monocytes Absolute 0.5  0.1 - 1.0 K/uL   Eosinophils Relative 2  0 - 5 %   Eosinophils Absolute 0.1  0.0 - 0.7 K/uL   Basophils Relative 1  0 - 1 %   Basophils Absolute 0.0  0.0 - 0.1 K/uL  BASIC METABOLIC PANEL      Result Value Ref Range   Sodium 141  137 - 147 mEq/L   Potassium 4.6  3.7 - 5.3 mEq/L   Chloride 103  96 - 112 mEq/L   CO2 25  19 - 32 mEq/L   Glucose, Bld 103 (*) 70 - 99 mg/dL   BUN 27 (*) 6 - 23 mg/dL   Creatinine, Ser 1.10  0.50 - 1.10 mg/dL   Calcium 10.6 (*) 8.4 - 10.5 mg/dL   GFR calc non Af Amer 49 (*) >90 mL/min   GFR calc Af Amer 57 (*) >90 mL/min   Anion gap 13  5 - 15   No results found.   Imaging Review Mr Brain Wo Contrast  04/17/2014   CLINICAL DATA:  Dizziness and balance disturbance today.  EXAM: MRI HEAD WITHOUT CONTRAST  TECHNIQUE: Multiplanar, multiecho pulse sequences of the brain and surrounding structures were obtained without intravenous contrast.  COMPARISON:  Head CT 11/21/2011  FINDINGS: Diffusion imaging does not show any acute or subacute infarction. The brainstem and cerebellum are normal. Within the cerebral hemispheres, there are a few scattered foci of T2 and FLAIR signal in the white matter consistent with minimal small vessel change, fairly typically seen and persons of this age. There is mild generalized atrophy. No cortical or large vessel territory infarction. No mass lesion, acute hemorrhage, hydrocephalus or extra-axial collection. There are a few punctate foci of hemosiderin deposition in the brain not likely significant. No hydrocephalus,  mass lesion or extra-axial collection. No pituitary mass. No fluid in the sinuses, middle ears or mastoids. No skull or skullbase lesion. Major vessels at the base of the brain show flow.  IMPRESSION: No acute finding. No cause of the presenting symptoms is identified. Ordinary age related brain atrophy with  mild chronic small-vessel change of the hemispheric white matter, fairly typical for age.   Electronically Signed   By: Nelson Chimes M.D.   On: 04/17/2014 19:34     EKG Interpretation None     Medications  sodium chloride 0.9 % bolus 500 mL (500 mLs Intravenous New Bag/Given 04/17/14 1959)  morphine 4 MG/ML injection 4 mg (4 mg Intravenous Given 04/17/14 2240)    6:35 PM- Treatment plan was discussed with patient who verbalizes understanding and agrees.   MDM   Final diagnoses:  Dizziness  Other fatigue    Pt is a 73 y.o. female with Pmhx as above who presents with onset of malaise,  Mild dizziness unchanged by position and mild ataxia since this morning. She denies CP, SOB, focal numbness or weakness. On PE. VSS, pt in NAD. No focal neuro findings, but reports sensation of feeling off balance when walking.  Given significant risk factors for CVA/TIA, MRI brain ordered and was negative. EKG w/o ischemic changes. No acute findings of UA, CBC, or BMP. After MRI pt began complaining of L arm pain. Rpeat EKG unchanged. On repeat exam, she reports a localized pain/paresthesia or L wrist just proximal to PIV site. IV removed. She is NVI distally and again has no focal neuro findings. Given she has just had a MRI, I do not feel she requires further w/u. She is not having CP/SOB and I do not feel this is an aginal equivalent given very localized reproducible symptoms. Will d/c home w/ plan for close outpt f/u with PCP. Return precautions given for new or worsening symptoms including worsening pain, focal neuro symptoms, fever.        I personally performed the services described in this documentation, which was scribed in my presence. The recorded information has been reviewed and is accurate.     Ernestina Patches, MD 04/17/14 2308

## 2014-04-17 NOTE — ED Notes (Signed)
2 attempts for PIV.

## 2014-04-17 NOTE — ED Notes (Signed)
PT to MRI

## 2014-04-17 NOTE — ED Notes (Signed)
Pt states that she was fine this am and had sudden onset of dizziness and weakness.  Pt is alert and oriented, speech clear, no focal weakness noted and no facial droop

## 2014-04-19 LAB — URINE CULTURE: Colony Count: 25000

## 2017-09-06 MED ORDER — CHOLECALCIFEROL 25 MCG (1000 UT) PO TABS
1000.00 | ORAL_TABLET | ORAL | Status: DC
Start: 2017-09-07 — End: 2017-09-06

## 2017-09-06 MED ORDER — MEMANTINE HCL 10 MG PO TABS
10.00 | ORAL_TABLET | ORAL | Status: DC
Start: 2017-09-06 — End: 2017-09-06

## 2017-09-06 MED ORDER — ASPIRIN EC 81 MG PO TBEC
81.00 | DELAYED_RELEASE_TABLET | ORAL | Status: DC
Start: 2017-09-07 — End: 2017-09-06

## 2017-09-06 MED ORDER — ONDANSETRON HCL 4 MG/2ML IJ SOLN
4.00 | INTRAMUSCULAR | Status: DC
Start: ? — End: 2017-09-06

## 2017-09-06 MED ORDER — ACETAMINOPHEN 325 MG PO TABS
650.00 | ORAL_TABLET | ORAL | Status: DC
Start: ? — End: 2017-09-06

## 2017-09-06 MED ORDER — MORPHINE SULFATE 4 MG/ML IJ SOLN
2.00 | INTRAMUSCULAR | Status: DC
Start: ? — End: 2017-09-06

## 2017-09-06 MED ORDER — SODIUM CHLORIDE 0.9 % IJ SOLN
5.00 | INTRAMUSCULAR | Status: DC
Start: 2017-09-06 — End: 2017-09-06

## 2017-09-06 MED ORDER — ATORVASTATIN CALCIUM 40 MG PO TABS
80.00 | ORAL_TABLET | ORAL | Status: DC
Start: 2017-09-06 — End: 2017-09-06

## 2017-09-06 MED ORDER — SODIUM CHLORIDE 0.9 % IJ SOLN
5.00 | INTRAMUSCULAR | Status: DC
Start: ? — End: 2017-09-06

## 2017-09-06 MED ORDER — METOPROLOL TARTRATE 50 MG PO TABS
50.00 | ORAL_TABLET | ORAL | Status: DC
Start: 2017-09-06 — End: 2017-09-06

## 2017-09-06 MED ORDER — ISOSORBIDE MONONITRATE ER 30 MG PO TB24
30.00 | ORAL_TABLET | ORAL | Status: DC
Start: 2017-09-07 — End: 2017-09-06

## 2017-09-06 MED ORDER — PANTOPRAZOLE SODIUM 40 MG PO TBEC
40.00 | DELAYED_RELEASE_TABLET | ORAL | Status: DC
Start: 2017-09-07 — End: 2017-09-06

## 2017-09-06 MED ORDER — TICAGRELOR 90 MG PO TABS
90.00 | ORAL_TABLET | ORAL | Status: DC
Start: 2017-09-06 — End: 2017-09-06

## 2017-09-06 MED ORDER — HYDROCODONE-ACETAMINOPHEN 5-325 MG PO TABS
1.00 | ORAL_TABLET | ORAL | Status: DC
Start: ? — End: 2017-09-06

## 2017-09-17 MED ORDER — SODIUM CHLORIDE 0.9 % IJ SOLN
5.00 | INTRAMUSCULAR | Status: DC
Start: 2017-09-17 — End: 2017-09-17

## 2017-09-17 MED ORDER — MEMANTINE HCL 10 MG PO TABS
10.00 | ORAL_TABLET | ORAL | Status: DC
Start: 2017-09-17 — End: 2017-09-17

## 2017-09-17 MED ORDER — BISACODYL 5 MG PO TBEC
10.00 | DELAYED_RELEASE_TABLET | ORAL | Status: DC
Start: ? — End: 2017-09-17

## 2017-09-17 MED ORDER — SODIUM CHLORIDE 0.9 % IJ SOLN
5.00 | INTRAMUSCULAR | Status: DC
Start: ? — End: 2017-09-17

## 2017-09-17 MED ORDER — TRAMADOL HCL 50 MG PO TABS
50.00 | ORAL_TABLET | ORAL | Status: DC
Start: ? — End: 2017-09-17

## 2017-09-17 MED ORDER — CHOLECALCIFEROL 25 MCG (1000 UT) PO TABS
1000.00 | ORAL_TABLET | ORAL | Status: DC
Start: 2017-09-18 — End: 2017-09-17

## 2017-09-17 MED ORDER — MAGNESIUM OXIDE 400 MG PO TABS
400.00 | ORAL_TABLET | ORAL | Status: DC
Start: 2017-09-17 — End: 2017-09-17

## 2017-09-17 MED ORDER — LEVOFLOXACIN 500 MG PO TABS
500.00 | ORAL_TABLET | ORAL | Status: DC
Start: 2017-09-17 — End: 2017-09-17

## 2017-09-17 MED ORDER — LOSARTAN POTASSIUM 50 MG PO TABS
50.00 | ORAL_TABLET | ORAL | Status: DC
Start: 2017-09-18 — End: 2017-09-17

## 2017-09-17 MED ORDER — HYDRALAZINE HCL 20 MG/ML IJ SOLN
10.00 | INTRAMUSCULAR | Status: DC
Start: ? — End: 2017-09-17

## 2017-09-17 MED ORDER — PANTOPRAZOLE 40 MG/20 ML SUSPENSION
40.00 | PACK | ORAL | Status: DC
Start: 2017-09-18 — End: 2017-09-17

## 2017-09-17 MED ORDER — ACETAMINOPHEN 650 MG RE SUPP
650.00 | RECTAL | Status: DC
Start: ? — End: 2017-09-17

## 2017-09-17 MED ORDER — DIPHENHYDRAMINE HCL 25 MG PO CAPS
25.00 | ORAL_CAPSULE | ORAL | Status: DC
Start: ? — End: 2017-09-17

## 2017-09-17 MED ORDER — ISOSORBIDE MONONITRATE ER 30 MG PO TB24
30.00 | ORAL_TABLET | ORAL | Status: DC
Start: 2017-09-18 — End: 2017-09-17

## 2017-09-17 MED ORDER — ATORVASTATIN CALCIUM 40 MG PO TABS
80.00 | ORAL_TABLET | ORAL | Status: DC
Start: 2017-09-17 — End: 2017-09-17

## 2017-09-17 MED ORDER — DOCUSATE SODIUM 100 MG PO CAPS
100.00 | ORAL_CAPSULE | ORAL | Status: DC
Start: ? — End: 2017-09-17

## 2017-09-17 MED ORDER — GENERIC EXTERNAL MEDICATION
1.00 | Status: DC
Start: ? — End: 2017-09-17

## 2017-09-17 MED ORDER — ACETAMINOPHEN 325 MG PO TABS
650.00 | ORAL_TABLET | ORAL | Status: DC
Start: ? — End: 2017-09-17

## 2017-09-17 MED ORDER — METOPROLOL TARTRATE 50 MG PO TABS
50.00 | ORAL_TABLET | ORAL | Status: DC
Start: 2017-09-17 — End: 2017-09-17

## 2017-09-17 MED ORDER — ONDANSETRON HCL 4 MG/2ML IJ SOLN
4.00 | INTRAMUSCULAR | Status: DC
Start: ? — End: 2017-09-17

## 2017-09-17 MED ORDER — ALUMINUM-MAGNESIUM-SIMETHICONE 200-200-20 MG/5ML PO SUSP
30.00 | ORAL | Status: DC
Start: ? — End: 2017-09-17

## 2017-12-19 MED ORDER — METOPROLOL TARTRATE 50 MG PO TABS
50.00 | ORAL_TABLET | ORAL | Status: DC
Start: 2017-12-19 — End: 2017-12-19

## 2017-12-19 MED ORDER — LOSARTAN POTASSIUM 25 MG PO TABS
25.00 | ORAL_TABLET | ORAL | Status: DC
Start: 2017-12-20 — End: 2017-12-19

## 2017-12-19 MED ORDER — MEMANTINE HCL 10 MG PO TABS
10.00 | ORAL_TABLET | ORAL | Status: DC
Start: 2017-12-19 — End: 2017-12-19

## 2017-12-19 MED ORDER — ASPIRIN EC 81 MG PO TBEC
81.00 | DELAYED_RELEASE_TABLET | ORAL | Status: DC
Start: 2017-12-20 — End: 2017-12-19

## 2017-12-19 MED ORDER — HYDRALAZINE HCL 20 MG/ML IJ SOLN
10.00 | INTRAMUSCULAR | Status: DC
Start: ? — End: 2017-12-19

## 2017-12-19 MED ORDER — HEPARIN SODIUM (PORCINE) 5000 UNIT/ML IJ SOLN
5000.00 | INTRAMUSCULAR | Status: DC
Start: 2017-12-19 — End: 2017-12-19

## 2017-12-19 MED ORDER — NITROGLYCERIN 0.4 MG SL SUBL
0.40 | SUBLINGUAL_TABLET | SUBLINGUAL | Status: DC
Start: ? — End: 2017-12-19

## 2017-12-19 MED ORDER — ACETAMINOPHEN 325 MG PO TABS
650.00 | ORAL_TABLET | ORAL | Status: DC
Start: ? — End: 2017-12-19

## 2017-12-19 MED ORDER — CLOPIDOGREL BISULFATE 75 MG PO TABS
75.00 | ORAL_TABLET | ORAL | Status: DC
Start: 2017-12-20 — End: 2017-12-19

## 2017-12-19 MED ORDER — GENERIC EXTERNAL MEDICATION
1.00 | Status: DC
Start: ? — End: 2017-12-19

## 2017-12-19 MED ORDER — PANTOPRAZOLE SODIUM 40 MG PO TBEC
40.00 | DELAYED_RELEASE_TABLET | ORAL | Status: DC
Start: 2017-12-20 — End: 2017-12-19

## 2017-12-19 MED ORDER — ZOLPIDEM TARTRATE 5 MG PO TABS
5.00 | ORAL_TABLET | ORAL | Status: DC
Start: ? — End: 2017-12-19

## 2017-12-19 MED ORDER — ONDANSETRON HCL 4 MG/2ML IJ SOLN
4.00 | INTRAMUSCULAR | Status: DC
Start: ? — End: 2017-12-19

## 2017-12-19 MED ORDER — LORAZEPAM 2 MG/ML IJ SOLN
0.50 | INTRAMUSCULAR | Status: DC
Start: ? — End: 2017-12-19

## 2017-12-19 MED ORDER — CHLORTHALIDONE 25 MG PO TABS
25.00 | ORAL_TABLET | ORAL | Status: DC
Start: 2017-12-20 — End: 2017-12-19

## 2017-12-19 MED ORDER — ATORVASTATIN CALCIUM 40 MG PO TABS
80.00 | ORAL_TABLET | ORAL | Status: DC
Start: 2017-12-20 — End: 2017-12-19

## 2019-05-07 MED ORDER — MEMANTINE HCL 10 MG PO TABS
10.00 | ORAL_TABLET | ORAL | Status: DC
Start: 2019-05-07 — End: 2019-05-07

## 2019-05-07 MED ORDER — ATORVASTATIN CALCIUM 40 MG PO TABS
80.00 | ORAL_TABLET | ORAL | Status: DC
Start: 2019-05-08 — End: 2019-05-07

## 2019-05-07 MED ORDER — PANTOPRAZOLE SODIUM 40 MG PO TBEC
40.00 | DELAYED_RELEASE_TABLET | ORAL | Status: DC
Start: 2019-05-08 — End: 2019-05-07

## 2019-05-07 MED ORDER — INSULIN LISPRO 100 UNIT/ML ~~LOC~~ SOLN
1.00 | SUBCUTANEOUS | Status: DC
Start: 2019-05-07 — End: 2019-05-07

## 2019-05-07 MED ORDER — ASPIRIN EC 81 MG PO TBEC
81.00 | DELAYED_RELEASE_TABLET | ORAL | Status: DC
Start: 2019-05-08 — End: 2019-05-07

## 2019-05-07 MED ORDER — ONDANSETRON HCL 4 MG/2ML IJ SOLN
4.00 | INTRAMUSCULAR | Status: DC
Start: ? — End: 2019-05-07

## 2019-05-07 MED ORDER — ONDANSETRON 4 MG PO TBDP
4.00 | ORAL_TABLET | ORAL | Status: DC
Start: ? — End: 2019-05-07

## 2019-05-07 MED ORDER — DEXTROSE 10 % IV SOLN
125.00 | INTRAVENOUS | Status: DC
Start: ? — End: 2019-05-07

## 2019-05-07 MED ORDER — DRONABINOL 2.5 MG PO CAPS
5.00 | ORAL_CAPSULE | ORAL | Status: DC
Start: 2019-05-07 — End: 2019-05-07

## 2019-05-07 MED ORDER — GENERIC EXTERNAL MEDICATION
Status: DC
Start: ? — End: 2019-05-07

## 2019-05-07 MED ORDER — HEPARIN SODIUM (PORCINE) 5000 UNIT/ML IJ SOLN
5000.00 | INTRAMUSCULAR | Status: DC
Start: 2019-05-07 — End: 2019-05-07

## 2019-05-07 MED ORDER — LACTATED RINGERS IV SOLN
INTRAVENOUS | Status: DC
Start: ? — End: 2019-05-07

## 2019-05-07 MED ORDER — ALUMINUM-MAGNESIUM-SIMETHICONE 200-200-20 MG/5ML PO SUSP
30.00 | ORAL | Status: DC
Start: ? — End: 2019-05-07

## 2019-05-07 MED ORDER — TRAMADOL HCL 50 MG PO TABS
50.00 | ORAL_TABLET | ORAL | Status: DC
Start: ? — End: 2019-05-07

## 2019-05-07 MED ORDER — ACETAMINOPHEN 325 MG PO TABS
650.00 | ORAL_TABLET | ORAL | Status: DC
Start: ? — End: 2019-05-07

## 2019-05-07 MED ORDER — GLUCAGON HCL RDNA (DIAGNOSTIC) 1 MG IJ SOLR
1.00 | INTRAMUSCULAR | Status: DC
Start: ? — End: 2019-05-07

## 2019-05-07 MED ORDER — GLUCOSE 40 % PO GEL
15.00 | ORAL | Status: DC
Start: ? — End: 2019-05-07

## 2019-06-30 DEATH — deceased
